# Patient Record
Sex: Female | Born: 1990 | Hispanic: Yes | Marital: Single | Smoking: Never smoker
Health system: Southern US, Community
[De-identification: ages and names within clinical notes are randomized; demographics above are authoritative.]

## PROBLEM LIST (undated history)

## (undated) DIAGNOSIS — N390 Urinary tract infection, site not specified: Secondary | ICD-10-CM

## (undated) HISTORY — PX: NO PAST SURGERIES: SHX2092

## (undated) HISTORY — DX: Urinary tract infection, site not specified: N39.0

---

## 2018-02-28 ENCOUNTER — Emergency Department: Payer: Self-pay

## 2018-02-28 ENCOUNTER — Other Ambulatory Visit: Payer: Self-pay

## 2018-02-28 ENCOUNTER — Emergency Department
Admission: EM | Admit: 2018-02-28 | Discharge: 2018-02-28 | Disposition: A | Discharge status: Home / Self Care | Payer: Self-pay | Attending: Emergency Medicine | Admitting: Emergency Medicine

## 2018-02-28 DIAGNOSIS — N938 Other specified abnormal uterine and vaginal bleeding: Secondary | ICD-10-CM | POA: Insufficient documentation

## 2018-02-28 DIAGNOSIS — N939 Abnormal uterine and vaginal bleeding, unspecified: Secondary | ICD-10-CM

## 2018-02-28 LAB — BASIC METABOLIC PANEL
Anion gap: 9 (ref 5–15)
BUN: 16 mg/dL (ref 6–20)
CALCIUM: 9.2 mg/dL (ref 8.9–10.3)
CHLORIDE: 106 mmol/L (ref 98–111)
CO2: 23 mmol/L (ref 22–32)
CREATININE: 0.69 mg/dL (ref 0.44–1.00)
GFR calc non Af Amer: >60 mL/min (ref >60)
Glucose, Bld: 89 mg/dL (ref 70–99)
Potassium: 4.2 mmol/L (ref 3.5–5.1)
SODIUM: 138 mmol/L (ref 135–145)

## 2018-02-28 LAB — CBC
HEMATOCRIT: 38 % (ref 35.0–47.0)
Hemoglobin: 13.6 g/dL (ref 12.0–16.0)
MCH: 32.4 pg (ref 26.0–34.0)
MCHC: 35.7 g/dL (ref 32.0–36.0)
MCV: 90.8 fL (ref 80.0–100.0)
Platelets: 288 10*3/uL (ref 150–440)
RBC: 4.18 MIL/uL (ref 3.80–5.20)
RDW: 12.7 % (ref 11.5–14.5)
WBC: 11.8 10*3/uL — ABNORMAL HIGH (ref 3.6–11.0)

## 2018-02-28 LAB — POCT PREGNANCY, URINE: Preg Test, Ur: NEGATIVE

## 2018-02-28 NOTE — ED Triage Notes (Signed)
Pt c/o heavy vaginal bleeding for the past 3 weeks, states she has been having issues with bleeding since last September and has been to a OB/GYn with no resolve. Pt c/o abd cramping.

## 2018-02-28 NOTE — ED Notes (Signed)
Interpreter at bedside.

## 2018-02-28 NOTE — ED Notes (Signed)
Pt able to ambulate to treatment room but appeared to be in pain when ambulating and took multiple breaks in the hall leaning over. Pt requesting an interpreter.

## 2018-02-28 NOTE — Discharge Instructions (Addendum)
Please seek medical attention for any high fevers, chest pain, shortness of breath, change in behavior, persistent vomiting, bloody stool or any other new or concerning symptoms.  

## 2018-02-28 NOTE — ED Notes (Addendum)
Interpreter requested 

## 2018-02-28 NOTE — ED Notes (Signed)
AAOx3.  Skin warm and dry.  NAD 

## 2018-02-28 NOTE — ED Notes (Signed)
Interpreter request made. 

## 2018-02-28 NOTE — ED Provider Notes (Signed)
[facility] Emergency Department Provider Note  ____________________________________________   I have reviewed the triage vital signs and the nursing notes.   HISTORY  Chief Complaint Vaginal Bleeding   History limited by: Language [facility] Interpreter utilized   HPI Janice Berry is a 27 y.o. female who presents to the emergency department today because of concerns for abnormal vaginal bleeding.  Patient states she has been having problems with vaginal bleeding for roughly 3 months.  Prior to the 3 months of abnormal vaginal bleeding she had gone a number of months without a period.  She stated that prior to that she had been quite regular.  She states that she saw her provider a little over 2 weeks ago who started her on birth control medications.  She however stopped these because she felt they are making her bleed more.  She has had some lower abdominal discomfort with the bleeding.   History reviewed. No pertinent past medical history.  There are no active problems to display for this patient.   History reviewed. No pertinent surgical history.  Prior to Admission medications   Not on File    Allergies Patient has no known allergies.  No family history on file.  Social History Social History   Tobacco Use  . Smoking status: Never Smoker  . Smokeless tobacco: Never Used  Substance Use Topics  . Alcohol use: Not Currently    Frequency: Never  . Drug use: Not Currently    Review of Systems Constitutional: No fever/chills Eyes: No visual changes. ENT: No sore throat. Cardiovascular: Denies chest pain. Respiratory: Denies shortness of breath. Gastrointestinal: Positive for suprapubic pain. Genitourinary: Negative for dysuria. Musculoskeletal: Negative for back pain. Skin: Negative for rash. Neurological: Negative for headaches, focal weakness or numbness.  ____________________________________________   PHYSICAL  EXAM:  VITAL SIGNS: ED Triage Vitals  Enc Vitals Group     BP 02/28/18 1320 121/68     Pulse Rate 02/28/18 1320 90     Resp 02/28/18 1320 18     Temp 02/28/18 1320 98.2 F (36.8 C)     Temp Source 02/28/18 1320 Oral     SpO2 02/28/18 1320 100 %     Weight 02/28/18 1320 160 lb (72.6 kg)     Height 02/28/18 1320 5\' 4"  (1.626 m)     Head Circumference --      Peak Flow --      Pain Score 02/28/18 1327 8   Constitutional: Alert and oriented.  Eyes: Conjunctivae are normal.  ENT      Head: Normocephalic and atraumatic.      Nose: No congestion/rhinnorhea.      Mouth/Throat: Mucous membranes are moist.      Neck: No stridor. Hematological/Lymphatic/Immunilogical: No cervical lymphadenopathy. Cardiovascular: Normal rate, regular rhythm.  No murmurs, rubs, or gallops.  Respiratory: Normal respiratory effort without tachypnea nor retractions. Breath sounds are clear and equal bilaterally. No wheezes/rales/rhonchi. Gastrointestinal: Soft and non tender. No rebound. No guarding.  Genitourinary: Deferred Musculoskeletal: Normal range of motion in all extremities. No lower extremity edema. Neurologic:  Normal speech and language. No gross focal neurologic deficits are appreciated.  Skin:  Skin is warm, dry and intact. No rash noted. Psychiatric: Mood and affect are normal. Speech and behavior are normal. Patient exhibits appropriate insight and judgment.  ____________________________________________    LABS (pertinent positives/negatives)  Upreg negative CBC wbc 11.8, hgb 13.6, plt 288 BMP wnl  ____________________________________________   EKG  None  ____________________________________________    RADIOLOGY  US No obvious etiology identified  ____________________________________________   PROCEDURES  Procedures  ____________________________________________   INITIAL IMPRESSION / ASSESSMENT AND PLAN / ED COURSE  Pertinent labs & imaging results that were  available during my care of the patient were reviewed by me and considered in my medical decision making (see chart for details).   Patient presented to the emergency department today because of concerns for abnormal vaginal bleeding.  Patient had been put on birth control for this but states that it seemed to cause worse bleeding.  Ultrasound was performed which did not show any obvious etiology.  Will give patient OB/GYN follow-up.  ____________________________________________   FINAL CLINICAL IMPRESSION(S) / ED DIAGNOSES  Final diagnoses:  Vagina bleeding     Note: This dictation was prepared with Dragon dictation. Any transcriptional errors that result from this process are unintentional     Phineas SemenGoodman, Cynde Menard, MD 02/28/18 1711

## 2018-06-07 NOTE — L&D Delivery Note (Addendum)
Delivery Note At 1:27 PM a viable and healthy female "Janice Berry" was delivered via Vaginal, Spontaneous (Presentation: LOA  ).  APGAR:8 9, ; weight  pending   Placenta status: spontaneous, intact .  Cord: 3VC with the following complications: none  Anesthesia:  none Episiotomy:  none Lacerations:  none Suture Repair: n/a Est. Blood Loss (mL):  300  Mom to postpartum.  Baby to Couplet care / Skin to Skin.  90WI [id number] at 39+4wks presenting with active labor, progressed quickly to 8cm dilated in triage. SROM for clear fluid while moving her to a labor room, where she pushed over an intact perineum in the bed and delivered the fetal head, followed promptly by the shoulders. She was in control the whole time, and the baby was placed on the maternal abdomen. Delayed cord clamping and the FOB cut his cord, while he was skin to skin. The placenta delivered spontaneously and intact, with trailing membranes. Small infraclitoral laceration that was hemostatic and did not require a stitch. Mom and baby tolerated the procedure well.  This delivery counts as one of Anderson Malta Oxley's supervised deliveries  Benjaman Kindler 04/28/2019, 1:46 PM

## 2018-10-11 ENCOUNTER — Other Ambulatory Visit: Payer: Self-pay | Admitting: Nurse Practitioner

## 2018-10-11 DIAGNOSIS — Z369 Encounter for antenatal screening, unspecified: Secondary | ICD-10-CM

## 2018-10-11 LAB — OB RESULTS CONSOLE VARICELLA ZOSTER ANTIBODY, IGG: Varicella: IMMUNE

## 2018-10-11 LAB — OB RESULTS CONSOLE HEPATITIS B SURFACE ANTIGEN: Hepatitis B Surface Ag: NEGATIVE

## 2018-10-11 LAB — OB RESULTS CONSOLE ABO/RH: RH Type: POSITIVE

## 2018-10-11 LAB — OB RESULTS CONSOLE RUBELLA ANTIBODY, IGM: Rubella: IMMUNE

## 2018-10-11 LAB — OB RESULTS CONSOLE HGB/HCT, BLOOD: Hemoglobin: 12.5

## 2018-10-11 LAB — OB RESULTS CONSOLE ANTIBODY SCREEN: Antibody Screen: NEGATIVE

## 2018-10-16 ENCOUNTER — Ambulatory Visit
Admission: RE | Admit: 2018-10-16 | Discharge: 2018-10-16 | Disposition: A | Discharge status: Home / Self Care | Payer: Self-pay | Source: Ambulatory Visit | Attending: Maternal & Fetal Medicine | Admitting: Maternal & Fetal Medicine

## 2018-10-16 ENCOUNTER — Other Ambulatory Visit: Payer: Self-pay

## 2018-10-16 DIAGNOSIS — Z36 Encounter for antenatal screening for chromosomal anomalies: Secondary | ICD-10-CM

## 2018-10-16 NOTE — Progress Notes (Signed)
Virtual Visit via Telephone Note  I connected with Janice Berry on Oct 16, 2018 at  9:00 AM EDT by telephone and verified that I am speaking with the correct person using two identifiers.  Referring physician:  [facility] Length of consultation:  45 minutes  Janice Berry  [facility] for genetic counseling to review prenatal screening and testing options.  This note summarizes the information we discussed with the aid of a Spanish interpreter.    We offered the following routine screening tests for this pregnancy:  The most accurate screening option for chromosome conditions is cell free fetal DNA testing.  Though this is typically reserved for pregnancies at increased risk for aneuploidy, it is currently being made available and many insurance companies are adding coverage for this testing in low risk patients during COVID.  This test utilizes a maternal blood sample and DNA sequencing technology to isolate circulating cell free fetal DNA from maternal plasma.  The fetal DNA can then be analyzed for DNA sequences that are derived from the three most common chromosomes involved in aneuploidy, chromosomes 13, 18, and 21.  If the overall amount of DNA is greater than the expected level for any of these chromosomes, aneuploidy is suspected.  The detection rates are >99% for Down syndrome, >98% for trisomy 18 and >91% for Trisomy 13.  While we do not consider it a replacement for invasive testing and karyotype analysis, a negative result from this testing would be reassuring, though not a guarantee of a normal chromosome complement for the baby.  An abnormal result may be suggestive of an abnormal chromosome complement, though we would still recommend CVS or amniocentesis to confirm any findings from this testing.  First trimester screening, which includes nuchal translucency ultrasound screen and first trimester maternal  serum marker screening, is the test that has most recently been available for low risk patients.  The nuchal translucency has approximately an 80% detection rate for Down syndrome and can be positive for other chromosome abnormalities as well as congenital heart defects.  When combined with a maternal serum marker screening, the detection rate is up to 90% for Down syndrome and up to 97% for trisomy 18.   Given current recommendations during COVID, we are offering only the biochemical testing portion of this testing (without the ultrasound and NT portion), which has a much lower detection rate.  Maternal serum marker screening, or "quad" screen, is a blood test that measures pregnancy proteins, can provide risk assessments for Down syndrome, trisomy 18, and open neural tube defects (spina bifida, anencephaly). Because it does not directly examine the fetus, it cannot positively diagnose or rule out these problems. This is a second trimester option which could be offered along with the anatomy ultrasound. It can detect approximately 75% of babies with Down syndrome, 80% of babies with open spina bifida and 70% of babies with trisomy 3418.  Targeted ultrasound uses high frequency sound waves to create an image of the developing fetus.  An ultrasound is often recommended as a routine means of evaluating the pregnancy.  It is also used to screen for fetal anatomy problems (for example, a heart defect) that might be suggestive of a chromosomal or other abnormality. We are currently not recommending a first trimester ultrasound other than that which would be ordered for dating and viability.  Should these screening tests indicate an increased concern, then the following additional testing options would be  offered:  The chorionic villus sampling procedure is available for first trimester chromosome analysis.  This involves the withdrawal of a small amount of chorionic villi (tissue from the developing placenta).   Risk of pregnancy loss is estimated to be approximately 1 in 200 to 1 in 100 (0.5 to 1%).  There is approximately a 1% (1 in 100) chance that the CVS chromosome results will be unclear.  Chorionic villi cannot be tested for neural tube defects.     Amniocentesis involves the removal of a small amount of amniotic fluid from the sac surrounding the fetus with the use of a thin needle inserted through the maternal abdomen and uterus.  Ultrasound guidance is used throughout the procedure.  Fetal cells from amniotic fluid are directly evaluated and > 99.5% of chromosome problems and > 98% of open neural tube defects can be detected. This procedure is generally performed after the 15th week of pregnancy.  The main risks to this procedure include complications leading to miscarriage in less than 1 in 200 cases (0.5%).  Cystic Fibrosis and Spinal Muscular Atrophy (SMA) screening were also discussed with the patient. Both conditions are recessive, which means that both parents must be carriers in order to have a child with the disease.  Cystic fibrosis (CF) is one of the most common genetic conditions in persons of Caucasian ancestry.  This condition occurs in approximately 1 in 2,500 Caucasian persons and results in thickened secretions in the lungs, digestive, and reproductive systems.  For a baby to be at risk for having CF, both of the parents must be carriers for this condition.  Approximately 1 in 6 Caucasian persons is a carrier for CF.  Current carrier testing looks for the most common mutations in the gene for CF and can detect approximately 90% of carriers in the Caucasian population.  This means that the carrier screening can greatly reduce, but cannot eliminate, the chance for an individual to have a child with CF.  If an individual is found to be a carrier for CF, then carrier testing would be available for the partner. As part of Janice Berry's newborn screening profile, all babies born in the state of  [place] will have a two-tier screening process.  Specimens are first tested to determine the concentration of immunoreactive trypsinogen (IRT).  The top 5% of specimens with the highest IRT values then undergo DNA testing using a panel of over 40 common CF mutations. SMA is a neurodegenerative disorder that leads to atrophy of skeletal muscle and overall weakness.  This condition is also more prevalent in the Caucasian population, with 1 in 40-1 in 60 persons being a carrier and 1 in 6,000-1 in 10,000 children being affected.  There are multiple forms of the disease, with some causing death in infancy to other forms with survival into adulthood.  The genetics of SMA is complex, but carrier screening can detect up to 95% of carriers in the Caucasian population.  Similar to CF, a negative result can greatly reduce, but cannot eliminate, the chance to have a child with SMA. The patient declined carrier screening for CF and SMA.  We talked about the option of signing up for Early Check to have the baby tested for SMA after delivery as part of a new study in [place].  This registration can be done online prior to delivery if desired. We also offered the option of carrier testing for hemoglobinopathies. The patient and her partner are of [place] ancestry.  We  obtained a detailed family history and pregnancy history.  This is the fourth pregnancy for this couple.  They have had two early miscarriages and have one 5 year old son. He was diagnosed with hearing loss that is said to have been congenital.  The patient reports that no testing has been done to determine a possible cause.  We reviewed that approximately have of congenital deafness may be inherited with recessive inheritance being the most common cause, but that without a known etiology in their son, it is not possible to offer genetic testing in this pregnancy.  The chance for recurrence may be 25% if his is due to a recessive gene inherited from both parents.   We encouraged her to follow up closely on hearing evaluations after birth for this baby.  The father of the baby's mother is said to have had 4-5 miscarriages, though these were in an area with no medical care and occurred early in pregnancy.  She did have 6 healthy children.  We discussed that there may be many reasons for pregnancy loss, including some inherited factors.  Because Janice Berry has only had 2 losses, we did not offer any additional evaluation for recurrent pregnancy loss at this time.  If the couple were to have another loss, then further assessment would be warranted. The remainder of the family history was reported to be unremarkable for birth defects, intellectual delays, recurrent pregnancy loss or known chromosome abnormalities.  In the current pregnancy, the patient reported light bleeding in late March at which time she was seen in the ER at [facility].  She has had no further complications and no exposures that would be expected to increase the risk for birth defects in this pregnancy. Per [facility] records, she had another ultrasound at [facility] at [redacted] weeks gestation, giving an EDC of 05/01/2019.  After consideration of the options, Ms. Macario Carls elected to have MaterniT21 PLUS with SCA drawn at a [facility].  The first trimester ultrasound scheduled for 10/19/2018 at [facility] was cancelled in an effort to minimize the number of visits that she needs to attend in person during COVID19 restrictions. An anatomy ultrasound will be scheduled at [redacted] weeks gestation in our clinic. The patient declined carrier testing for hemoglobinopathies, CF and SMA.  The patient e was encouraged to call with questions or concerns.  We can be contacted at [phone].  Labs ordered: MaterniT21 PLUS with SCA (at [facility])  Janice Anderson, MS, CGC      I provided 45 minutes of non-face-to-face time during this encounter.   Janice Berry

## 2018-10-16 NOTE — Progress Notes (Signed)
Hx reviewed by me, agree with assessment and plan as outlined in [facility]'s note. 

## 2018-10-19 ENCOUNTER — Ambulatory Visit: Payer: Self-pay

## 2018-10-23 ENCOUNTER — Other Ambulatory Visit: Payer: Self-pay | Admitting: Obstetrics and Gynecology

## 2018-10-27 LAB — MATERNIT21 PLUS CORE+SCA
Fetal Fraction: 7
Monosomy X (Turner Syndrome): NOT DETECTED
Result (T21): NEGATIVE
Trisomy 13 (Patau syndrome): NEGATIVE
Trisomy 18 (Edwards syndrome): NEGATIVE
Trisomy 21 (Down syndrome): NEGATIVE
XXX (Triple X Syndrome): NOT DETECTED
XXY (Klinefelter Syndrome): NOT DETECTED
XYY (Jacobs Syndrome): NOT DETECTED

## 2018-11-02 ENCOUNTER — Telehealth: Payer: Self-pay | Admitting: Obstetrics and Gynecology

## 2018-11-02 NOTE — Telephone Encounter (Signed)
The patient was informed of the results of her recent MaterniT21 testing which yielded NEGATIVE results.  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  The sensitivity for trisomy 36, trisomy 61 and trisomy 88 using this testing are reported as 99.1%, 99.9% and 91.7% respectively.  Thus, while the results of this testing are highly accurate, they are not considered diagnostic at this time.  Should more definitive information be desired, the patient may still consider amniocentesis.   As requested to know by the patient, sex chromosome analysis was included for this sample.  Results are consistent with a female fetus (Y chromosome material was present). This is predicted with >99% accuracy.  A maternal serum AFP only should be considered if screening for neural tube defects is desired.  We may be reached at [phone] with any questions or concerns.   Cherly Anderson, MS, CGC

## 2018-11-19 ENCOUNTER — Emergency Department
Admission: EM | Admit: 2018-11-19 | Discharge: 2018-11-20 | Disposition: A | Discharge status: Home / Self Care | Payer: HRSA Program | Attending: Emergency Medicine | Admitting: Emergency Medicine

## 2018-11-19 ENCOUNTER — Encounter: Payer: Self-pay | Admitting: Emergency Medicine

## 2018-11-19 DIAGNOSIS — R509 Fever, unspecified: Secondary | ICD-10-CM | POA: Diagnosis not present

## 2018-11-19 DIAGNOSIS — Z3A16 16 weeks gestation of pregnancy: Secondary | ICD-10-CM | POA: Insufficient documentation

## 2018-11-19 DIAGNOSIS — U071 COVID-19: Secondary | ICD-10-CM | POA: Insufficient documentation

## 2018-11-19 DIAGNOSIS — O98512 Other viral diseases complicating pregnancy, second trimester: Secondary | ICD-10-CM | POA: Diagnosis present

## 2018-11-19 LAB — HCG, QUANTITATIVE, PREGNANCY: hCG, Beta Chain, Quant, S: 64831 m[IU]/mL — ABNORMAL HIGH (ref <5)

## 2018-11-19 LAB — URINALYSIS, COMPLETE (UACMP) WITH MICROSCOPIC
Bilirubin Urine: NEGATIVE
Glucose, UA: NEGATIVE mg/dL
Hgb urine dipstick: NEGATIVE
Ketones, ur: 20 mg/dL — AB
Nitrite: NEGATIVE
Protein, ur: 30 mg/dL — AB
Specific Gravity, Urine: 1.021 (ref 1.005–1.030)
pH: 7 (ref 5.0–8.0)

## 2018-11-19 LAB — CBC
HCT: 34.3 % — ABNORMAL LOW (ref 36.0–46.0)
Hemoglobin: 11.7 g/dL — ABNORMAL LOW (ref 12.0–15.0)
MCH: 30.3 pg (ref 26.0–34.0)
MCHC: 34.1 g/dL (ref 30.0–36.0)
MCV: 88.9 fL (ref 80.0–100.0)
Platelets: 213 10*3/uL (ref 150–400)
RBC: 3.86 MIL/uL — ABNORMAL LOW (ref 3.87–5.11)
RDW: 12.7 % (ref 11.5–15.5)
WBC: 7.9 10*3/uL (ref 4.0–10.5)
nRBC: 0 % (ref 0.0–0.2)

## 2018-11-19 LAB — COMPREHENSIVE METABOLIC PANEL
ALT: 29 U/L (ref 0–44)
AST: 39 U/L (ref 15–41)
Albumin: 3.7 g/dL (ref 3.5–5.0)
Alkaline Phosphatase: 69 U/L (ref 38–126)
Anion gap: 9 (ref 5–15)
BUN: 6 mg/dL (ref 6–20)
CO2: 23 mmol/L (ref 22–32)
Calcium: 8.9 mg/dL (ref 8.9–10.3)
Chloride: 101 mmol/L (ref 98–111)
Creatinine, Ser: 0.58 mg/dL (ref 0.44–1.00)
GFR calc Af Amer: >60 mL/min (ref >60)
GFR calc non Af Amer: >60 mL/min (ref >60)
Glucose, Bld: 114 mg/dL — ABNORMAL HIGH (ref 70–99)
Potassium: 3.2 mmol/L — ABNORMAL LOW (ref 3.5–5.1)
Sodium: 133 mmol/L — ABNORMAL LOW (ref 135–145)
Total Bilirubin: 0.3 mg/dL (ref 0.3–1.2)
Total Protein: 7.5 g/dL (ref 6.5–8.1)

## 2018-11-19 LAB — SARS CORONAVIRUS 2 BY RT PCR (HOSPITAL ORDER, PERFORMED IN ~~LOC~~ HOSPITAL LAB): SARS Coronavirus 2: POSITIVE — AB

## 2018-11-19 LAB — TROPONIN I: Troponin I: <0.03 ng/mL (ref <0.03)

## 2018-11-19 MED ORDER — SODIUM CHLORIDE 0.9 % IV BOLUS: 1000.0000 mL | Freq: Once | INTRAVENOUS | Status: DC

## 2018-11-19 MED ORDER — ACETAMINOPHEN 500 MG PO TABS
1000.0000 mg | ORAL_TABLET | Freq: Once | ORAL | Status: AC
  Administered 2018-11-19: 1000 mg via ORAL
  Filled 2018-11-19: qty 2

## 2018-11-19 NOTE — ED Provider Notes (Signed)
[facility] Emergency Department Provider Note  Time seen: 9:47 PM  I have reviewed the triage vital signs and the nursing notes.   HISTORY  Chief Complaint Fever and Generalized Body Aches   HPI Janice Berry is a 28 y.o. female approximately [redacted] weeks pregnant who presents emergency department for body aches, fever, headache and weakness.  According to the patient over the past 3 days she has developed intermittent headaches, fevers high as 101 at home, generalized body aches and weakness symptoms.  Patient denies any cough shortness of breath or chest pain.  Denies any abdominal pain dysuria vaginal bleeding or discharge.  Denies any known sick contacts.  States currently mild to moderate headache dull in quality.  History reviewed. No pertinent past medical history.  Patient Active Problem List   Diagnosis Date Noted  . Encounter for antenatal screening for chromosomal anomalies     History reviewed. No pertinent surgical history.  Prior to Admission medications   Not on File    No Known Allergies  No family history on file.  Social History Social History   Tobacco Use  . Smoking status: Never Smoker  . Smokeless tobacco: Never Used  Substance Use Topics  . Alcohol use: Not Currently    Frequency: Never  . Drug use: Not Currently    Review of Systems Constitutional: Positive for fever.  Positive for generalized weakness. ENT: Negative for recent illness/congestion Cardiovascular: Negative for chest pain. Respiratory: Negative for shortness of breath.  Negative for cough. Gastrointestinal: Negative for abdominal pain, vomiting and diarrhea. Genitourinary: Negative for urinary compaints Musculoskeletal: Positive for generalized body aches. Skin: Negative for skin complaints  Neurological: Intermittent headache x3 days. All other ROS negative  ____________________________________________   PHYSICAL EXAM:  VITAL SIGNS: ED  Triage Vitals  Enc Vitals Group     BP 11/19/18 2133 127/71     Pulse Rate 11/19/18 2133 (!) 145     Resp 11/19/18 2133 18     Temp 11/19/18 2133 98.6 F (37 C)     Temp Source 11/19/18 2133 Oral     SpO2 11/19/18 2133 98 %     Weight 11/19/18 2134 169 lb (76.7 kg)     Height 11/19/18 2134 5' 0.63" (1.54 m)     Head Circumference --      Peak Flow --      Pain Score 11/19/18 2133 7     Pain Loc --      Pain Edu? --      Excl. in GC? --    Constitutional: Alert and oriented. Well appearing and in no distress. Eyes: Normal exam ENT      Head: Normocephalic and atraumatic      Mouth/Throat: Mucous membranes are moist. Cardiovascular: Normal rate, regular rhythm. No murmur Respiratory: Normal respiratory effort without tachypnea nor retractions. Breath sounds are clear Gastrointestinal: Soft and nontender. No distention. Musculoskeletal: Nontender with normal range of motion in all extremities. Neurologic:  Normal speech and language. No gross focal neurologic deficits Skin:  Skin is warm, dry and intact.  Psychiatric: Mood and affect are normal.   ____________________________________________    EKG  EKG viewed and interpreted by myself shows sinus tachycardia 144 bpm with a narrow QRS, normal axis, normal intervals, no concerning ST changes.  ____________________________________________    INITIAL IMPRESSION / ASSESSMENT AND PLAN / ED COURSE  Pertinent labs & imaging results that were available during my care of the patient were reviewed by me  and considered in my medical decision making (see chart for details).   Presents to the emergency department for generalized body aches, fever, intermittent headache, weakness.  Patient is afebrile currently but states she took Tylenol approximately 7 and half hours ago.  Overall the patient appears well, she is found to be quite tachycardic around 140 bpm.  Patient denies any shortness of breath.  Denies any chest pain.  She is  currently 98 to 100% on room air.  Differential at this time would include infectious etiologies such as viral infection, upper respiratory infection, COVID, urinary tract infection.  We will check labs, corona swab, urinalysis, IV hydrate and continue to closely monitor while awaiting results.  We will re-dose Tylenol as well.  Patient agreeable to plan of care.  Patient's coronavirus test is positive.  Patient's labs otherwise largely within normal limits.  We will discharge with supportive care.  Patient to call her OB/GYN first thing in the morning.  Discussed Tylenol every 6 hours as needed for fever/discomfort.  Janice Berry was evaluated in Emergency Department on 11/19/2018 for the symptoms described in the history of present illness. She was evaluated in the context of the global COVID-19 pandemic, which necessitated consideration that the patient might be at risk for infection with the SARS-CoV-2 virus that causes COVID-19. Institutional protocols and algorithms that pertain to the evaluation of patients at risk for COVID-19 are in a state of rapid change based on information released by regulatory bodies including the CDC and federal and state organizations. These policies and algorithms were followed during the patient's care in the ED.  ____________________________________________   FINAL CLINICAL IMPRESSION(S) / ED DIAGNOSES  COVID-19   Harvest Dark, MD 11/19/18 2321

## 2018-11-19 NOTE — ED Notes (Signed)
CRITICAL LAB: COVID is POSITIVE, Lab, Dr. Owens Shark and [facility] notified, orders received

## 2018-11-19 NOTE — Discharge Instructions (Addendum)
Person Under Monitoring Name: Janice Berry  Location: [place] [address]   Infection Prevention Recommendations for Individuals Confirmed to have, or Being Evaluated for, 2019 Novel Coronavirus (COVID-19) Infection Who Receive Care at Home  Individuals who are confirmed to have, or are being evaluated for, COVID-19 should follow the prevention steps below until a healthcare provider or local or state health department says they can return to normal activities.  Stay home except to get medical care You should restrict activities outside your home, except for getting medical care. Do not go to work, school, or public areas, and do not use public transportation or taxis.  Call ahead before visiting your doctor Before your medical appointment, call the healthcare provider and tell them that you have, or are being evaluated for, COVID-19 infection. This will help the healthcare providers office take steps to keep other people from getting infected. Ask your healthcare provider to call the local or state health department.  Monitor your symptoms Seek prompt medical attention if your illness is worsening (e.g., difficulty breathing). Before going to your medical appointment, call the healthcare provider and tell them that you have, or are being evaluated for, COVID-19 infection. Ask your healthcare provider to call the local or state health department.  Wear a facemask You should wear a facemask that covers your nose and mouth when you are in the same room with other people and when you visit a healthcare provider. People who live with or visit you should also wear a facemask while they are in the same room with you.  Separate yourself from other people in your home As much as possible, you should stay in a different room from other people in your home. Also, you should use a separate bathroom, if available.  Avoid sharing household items You should  not share dishes, drinking glasses, cups, eating utensils, towels, bedding, or other items with other people in your home. After using these items, you should wash them thoroughly with soap and water.  Cover your coughs and sneezes Cover your mouth and nose with a tissue when you cough or sneeze, or you can cough or sneeze into your sleeve. Throw used tissues in a lined trash can, and immediately wash your hands with soap and water for at least 20 seconds or use an alcohol-based hand rub.  Wash your [facility] your hands often and thoroughly with soap and water for at least 20 seconds. You can use an alcohol-based hand sanitizer if soap and water are not available and if your hands are not visibly dirty. Avoid touching your eyes, nose, and mouth with unwashed hands.   Prevention Steps for Caregivers and Household Members of Individuals Confirmed to have, or Being Evaluated for, COVID-19 Infection Being Cared for in the Home  If you live with, or provide care at home for, a person confirmed to have, or being evaluated for, COVID-19 infection please follow these guidelines to prevent infection:  Follow healthcare providers instructions Make sure that you understand and can help the patient follow any healthcare provider instructions for all care.  Provide for the patients basic needs You should help the patient with basic needs in the home and provide support for getting groceries, prescriptions, and other personal needs.  Monitor the patients symptoms If they are getting sicker, call his or her medical provider and tell them that the patient has, or is being evaluated for, COVID-19 infection. This will help the healthcare  providers office take steps to keep other people from getting infected. Ask the healthcare provider to call the local or state health department.  Limit the number of people who have contact with the patient If possible, have only one caregiver for the  patient. Other household members should stay in another home or place of residence. If this is not possible, they should stay in another room, or be separated from the patient as much as possible. Use a separate bathroom, if available. Restrict visitors who do not have an essential need to be in the home.  Keep older adults, very young children, and other sick people away from the patient Keep older adults, very young children, and those who have compromised immune systems or chronic health conditions away from the patient. This includes people with chronic heart, lung, or kidney conditions, diabetes, and cancer.  Ensure good ventilation Make sure that shared spaces in the home have good air flow, such as from an air conditioner or an opened window, weather permitting.  Wash your hands often Wash your hands often and thoroughly with soap and water for at least 20 seconds. You can use an alcohol based hand sanitizer if soap and water are not available and if your hands are not visibly dirty. Avoid touching your eyes, nose, and mouth with unwashed hands. Use disposable paper towels to dry your hands. If not available, use dedicated cloth towels and replace them when they become wet.  Wear a facemask and gloves Wear a disposable facemask at all times in the room and gloves when you touch or have contact with the patients blood, body fluids, and/or secretions or excretions, such as sweat, saliva, sputum, nasal mucus, vomit, urine, or feces.  Ensure the mask fits over your nose and mouth tightly, and do not touch it during use. Throw out disposable facemasks and gloves after using them. Do not reuse. Wash your hands immediately after removing your facemask and gloves. If your personal clothing becomes contaminated, carefully remove clothing and launder. Wash your hands after handling contaminated clothing. Place all used disposable facemasks, gloves, and other waste in a lined container before  disposing them with other household waste. Remove gloves and wash your hands immediately after handling these items.  Do not share dishes, glasses, or other household items with the patient Avoid sharing household items. You should not share dishes, drinking glasses, cups, eating utensils, towels, bedding, or other items with a patient who is confirmed to have, or being evaluated for, COVID-19 infection. After the person uses these items, you should wash them thoroughly with soap and water.  Wash laundry thoroughly Immediately remove and wash clothes or bedding that have blood, body fluids, and/or secretions or excretions, such as sweat, saliva, sputum, nasal mucus, vomit, urine, or feces, on them. Wear gloves when handling laundry from the patient. Read and follow directions on labels of laundry or clothing items and detergent. In general, wash and dry with the warmest temperatures recommended on the label.  Clean all areas the individual has used often Clean all touchable surfaces, such as counters, tabletops, doorknobs, bathroom fixtures, toilets, phones, keyboards, tablets, and bedside tables, every day. Also, clean any surfaces that may have blood, body fluids, and/or secretions or excretions on them. Wear gloves when cleaning surfaces the patient has come in contact with. Use a diluted bleach solution (e.g., dilute bleach with 1 part bleach and 10 parts water) or a household disinfectant with a label that says EPA-registered for coronaviruses. To make  a bleach solution at home, add 1 tablespoon of bleach to 1 quart (4 cups) of water. For a larger supply, add  cup of bleach to 1 gallon (16 cups) of water. Read labels of cleaning products and follow recommendations provided on product labels. Labels contain instructions for safe and effective use of the cleaning product including precautions you should take when applying the product, such as wearing gloves or eye protection and making sure you  have good ventilation during use of the product. Remove gloves and wash hands immediately after cleaning.  Monitor yourself for signs and symptoms of illness Caregivers and household members are considered close contacts, should monitor their health, and will be asked to limit movement outside of the home to the extent possible. Follow the monitoring steps for close contacts listed on the symptom monitoring form.   ? If you have additional questions, contact your local health department or call the epidemiologist on call at [phone] (available 24/7). ? This guidance is subject to change. For the most up-to-date guidance from [facility], please refer to their website: [email or web address]

## 2018-11-19 NOTE — ED Notes (Signed)
ED Provider at bedside with interpreter, [facility]

## 2018-11-19 NOTE — ED Triage Notes (Signed)
Patient c/o fever, body aches, headache beginning Friday. Patient is pregnant, EDD 11/4. Patient last took 500mg  tylenol at 1400.

## 2018-11-20 NOTE — ED Notes (Signed)
Peripheral IV discontinued. Catheter intact. No signs of infiltration or redness. Gauze applied to IV site.   Discharge instructions reviewed with patient. Questions fielded by this RN. Patient verbalizes understanding of instructions. Patient discharged home in stable condition per paduchowski. No acute distress noted at time of discharge.   Delay to DC d/t looking for COVID dc instructions in Spanish - none found and explained to pt with interpreter STRATUS at bedside. Pt ambulatory to parking lot, to husband's car, through side door.

## 2018-11-21 LAB — URINE CULTURE: Culture: <10000 — AB

## 2018-11-27 ENCOUNTER — Other Ambulatory Visit: Payer: Self-pay

## 2018-11-27 DIAGNOSIS — O0992 Supervision of high risk pregnancy, unspecified, second trimester: Secondary | ICD-10-CM

## 2018-11-29 DIAGNOSIS — Z3480 Encounter for supervision of other normal pregnancy, unspecified trimester: Secondary | ICD-10-CM | POA: Insufficient documentation

## 2018-11-29 DIAGNOSIS — Z8616 Personal history of COVID-19: Secondary | ICD-10-CM | POA: Insufficient documentation

## 2018-11-29 DIAGNOSIS — O0992 Supervision of high risk pregnancy, unspecified, second trimester: Secondary | ICD-10-CM

## 2018-11-29 DIAGNOSIS — Z8619 Personal history of other infectious and parasitic diseases: Secondary | ICD-10-CM | POA: Insufficient documentation

## 2018-11-29 DIAGNOSIS — R4589 Other symptoms and signs involving emotional state: Secondary | ICD-10-CM | POA: Insufficient documentation

## 2018-11-29 DIAGNOSIS — Z20828 Contact with and (suspected) exposure to other viral communicable diseases: Secondary | ICD-10-CM

## 2018-11-30 ENCOUNTER — Other Ambulatory Visit: Payer: Self-pay

## 2018-12-06 ENCOUNTER — Ambulatory Visit: Payer: HRSA Program | Admitting: Nurse Practitioner

## 2018-12-06 ENCOUNTER — Encounter: Payer: Self-pay | Admitting: Nurse Practitioner

## 2018-12-06 ENCOUNTER — Other Ambulatory Visit: Payer: Self-pay

## 2018-12-06 DIAGNOSIS — O0992 Supervision of high risk pregnancy, unspecified, second trimester: Secondary | ICD-10-CM

## 2018-12-06 DIAGNOSIS — Z20828 Contact with and (suspected) exposure to other viral communicable diseases: Secondary | ICD-10-CM

## 2018-12-06 DIAGNOSIS — R4589 Other symptoms and signs involving emotional state: Secondary | ICD-10-CM

## 2018-12-06 NOTE — Progress Notes (Signed)
TC to patient for Maternity Telehealth appt. Patient denies all covid 19 symptoms and questions, and states she feels better and is no longer in quarantine. Patient states she called [facility] on 11/30/2018 to cancel U/S appt. Because she was under quarantine. She states they told her at that time that they would call her back to reschedule, but patient has not heard back yet. Patient counseled to call [facility] tomorrow to try to reschedule.Jenetta Downer, RN  Patient states her child born in 2014 was born in [place] on 05/07/2013, and the child's due date was 05/27/2015. According to that information, child was born at 13 1/7, and was not premature.Jenetta Downer, RN

## 2018-12-06 NOTE — Progress Notes (Signed)
    PRENATAL VISIT NOTE  Subjective:  Janice Berry is a 28 y.o. [phone] at [redacted]w[redacted]d being seen today for ongoing prenatal care.  She is currently monitored for the following issues for this high-risk pregnancy and has Encounter for antenatal screening for chromosomal anomalies; Exposure to SARS virus; Supervision of high risk pregnancy in second trimester; and Other symptoms and signs involving emotional state on their problem list.  Patient reports no complaints.  Contractions: Not present. Vag. Bleeding: None.  Movement: Present. denies vomiting, abdominal pain, fussiness, diarrhea, cough and difficulty breathing leaking of fluid/ROM.   The following portions of the patient's history were reviewed and updated as appropriate: allergies, current medications, past family history, past medical history, past social history, past surgical history and problem list. Problem list updated.  Objective:  There were no vitals filed for this visit.  Fetal Status:     Movement: Present     General:  Alert, oriented and cooperative. Patient is in no acute distress.  Skin: Skin is warm and dry. No rash noted.   Cardiovascular: Normal heart rate noted  Respiratory: Normal respiratory effort, no problems with respiration noted  Abdomen: Soft, gravid, appropriate for gestational age.  Pain/Pressure: Absent     Pelvic: Cervical exam deferred        Extremities: Normal range of motion.  Edema: None  Mental Status: Normal mood and affect. Normal behavior. Normal judgment and thought content.   Assessment and Plan:  Pregnancy: G4P1021 at [redacted]w[redacted]d  1. Exposure to SARS virus Continue to monitor  2. Supervision of high risk pregnancy in second trimester Doing well - teleheatlh visit completed  3. Other symptoms and signs involving emotional state Denies any concerns at this time   Preterm labor symptoms and general obstetric precautions including but not limited to vaginal bleeding, contractions,  leaking of fluid and fetal movement were reviewed in detail with the patient. Please refer to After Visit Summary for other counseling recommendations.  No follow-ups on file.  Future Appointments  Date Time Provider [place]  01/01/2019  8:50 AM [facility] PROVIDER AC-[facility] None    Berniece Andreas, NP

## 2018-12-18 ENCOUNTER — Ambulatory Visit
Admission: RE | Admit: 2018-12-18 | Discharge: 2018-12-18 | Disposition: A | Discharge status: Home / Self Care | Payer: Self-pay | Source: Ambulatory Visit | Attending: Maternal & Fetal Medicine | Admitting: Maternal & Fetal Medicine

## 2018-12-18 DIAGNOSIS — Z20828 Contact with and (suspected) exposure to other viral communicable diseases: Secondary | ICD-10-CM

## 2018-12-18 DIAGNOSIS — R4589 Other symptoms and signs involving emotional state: Secondary | ICD-10-CM

## 2018-12-18 DIAGNOSIS — O0992 Supervision of high risk pregnancy, unspecified, second trimester: Secondary | ICD-10-CM | POA: Insufficient documentation

## 2018-12-18 DIAGNOSIS — Z3A20 20 weeks gestation of pregnancy: Secondary | ICD-10-CM | POA: Insufficient documentation

## 2018-12-24 NOTE — Progress Notes (Signed)
     PRENATAL VISIT NOTE  Subjective:  Janice Berry is a 28 y.o. [phone] at [redacted]w[redacted]d being seen today for ongoing prenatal care.  She is currently monitored for the following issues for this high-risk pregnancy and has Encounter for antenatal screening for chromosomal anomalies; Exposure to SARS virus; Supervision of high risk pregnancy in second trimester; and Other symptoms and signs involving emotional state on their problem list.  Patient reports no complaints.  Contractions: Not present. Vag. Bleeding: None.  Movement: Present. denies vomiting, abdominal pain, fussiness, diarrhea, cough and difficulty breathing leaking of fluid/ROM.   The following portions of the patient's history were reviewed and updated as appropriate: allergies, current medications, past family history, past medical history, past social history, past surgical history and problem list. Problem list updated.  Objective:  There were no vitals filed for this visit.  Fetal Status:     Movement: Present     General:  Alert, oriented and cooperative. Patient is in no acute distress.  Skin: Skin is warm and dry. No rash noted.   Cardiovascular: Normal heart rate noted  Respiratory: Normal respiratory effort, no problems with respiration noted  Abdomen: Soft, gravid, appropriate for gestational age.  Pain/Pressure: Absent     Pelvic: Cervical exam deferred        Extremities: Normal range of motion.  Edema: None  Mental Status: Normal mood and affect. Normal behavior. Normal judgment and thought content.   Assessment and Plan:  Pregnancy: [id number] at [redacted]w[redacted]d  Preterm labor symptoms and general obstetric precautions including but not limited to vaginal bleeding, contractions, leaking of fluid and fetal movement were reviewed in detail with the patient. Please refer to After Visit Summary for other counseling recommendations.  Return in about 4 weeks (around 01/03/2019) for routine prenatal care.  Future  Appointments  Date Time Provider [place]  01/01/2019  8:50 AM [facility] PROVIDER AC-MAT None    Berniece Andreas, NP   Subjective:  [name] is a 28 y.o. female.  The patient reports they do not have symptoms.  Patient has the following medical conditionshas Encounter for antenatal screening for chromosomal anomalies; Exposure to SARS virus; Supervision of high risk pregnancy in second trimester; and Other symptoms and signs involving emotional state on their problem list.  Chief Complaint  Patient presents with  . Routine Prenatal Visit    Telehealth appt.    Patient reports  HPI   See flowsheet for further details and programmatic requirements.    The following portions of the patient's history were reviewed and updated as appropriate: allergies, current medications, past family history, past medical history, past social history, past surgical history and problem list. Problem list updated.  Objective:  There were no vitals filed for this visit.  Physical Exam    Assessment and Plan:  [name] is a 28 y.o. female      Return in about 4 weeks (around 01/03/2019) for routine prenatal care.  Future Appointments  Date Time Provider [place]  01/01/2019  8:50 AM AC-MH PROVIDER AC-MAT None    Berniece Andreas, NP

## 2019-01-01 ENCOUNTER — Ambulatory Visit: Payer: HRSA Program | Admitting: Nurse Practitioner

## 2019-01-01 ENCOUNTER — Other Ambulatory Visit: Payer: Self-pay

## 2019-01-01 DIAGNOSIS — R4589 Other symptoms and signs involving emotional state: Secondary | ICD-10-CM

## 2019-01-01 DIAGNOSIS — O0992 Supervision of high risk pregnancy, unspecified, second trimester: Secondary | ICD-10-CM

## 2019-01-01 DIAGNOSIS — Z20828 Contact with and (suspected) exposure to other viral communicable diseases: Secondary | ICD-10-CM

## 2019-01-01 NOTE — Progress Notes (Signed)
Client denies any headaches or vaginal bleeding Denies any questions at this time   PRENATAL VISIT NOTE  Subjective:  Janice Berry is a 28 y.o. G4P1021 at [redacted]w[redacted]d being seen today for ongoing prenatal care.  She is currently monitored for the following issues for this high-risk pregnancy and has Encounter for antenatal screening for chromosomal anomalies; Exposure to SARS virus; Supervision of high risk pregnancy in second trimester; and Other symptoms and signs involving emotional state on their problem list.  Patient reports no complaints.   .  .   . Denies leaking of fluid/ROM.   The following portions of the patient's history were reviewed and updated as appropriate: allergies, current medications, past family history, past medical history, past social history, past surgical history and problem list. Problem list updated.  Objective:  There were no vitals filed for this visit.  Fetal Status:           General:  Alert, oriented and cooperative. Patient is in no acute distress.  Skin: Skin is warm and dry. No rash noted.   Cardiovascular: Normal heart rate noted  Respiratory: Normal respiratory effort, no problems with respiration noted  Abdomen: Soft, gravid, appropriate for gestational age.        Pelvic: Cervical exam deferred        Extremities: Normal range of motion.     Mental Status: Normal mood and affect. Normal behavior. Normal judgment and thought content.   Assessment and Plan:  Pregnancy: G4P1021 at [redacted]w[redacted]d   1. Supervision of high risk pregnancy in second trimester Doing well Advised 4 wks telehealth visit Then 1 wk 28 wk lab visit in house     Preterm labor symptoms and general obstetric precautions including but not limited to vaginal bleeding, contractions, leaking of fluid and fetal movement were reviewed in detail with the patient. Please refer to After Visit Summary for other counseling recommendations.  No follow-ups on file.  No future  appointments.  Berniece Andreas, NP

## 2019-01-01 NOTE — Progress Notes (Signed)
Recent +Covid in June; removed from isolation and denies exposure or s/s for son accompanying; [name] updated today Janice Lat, Janice Berry

## 2019-01-29 ENCOUNTER — Ambulatory Visit: Payer: Self-pay

## 2019-01-30 ENCOUNTER — Encounter: Payer: Self-pay | Admitting: Advanced Practice Midwife

## 2019-01-30 ENCOUNTER — Ambulatory Visit: Payer: Self-pay

## 2019-01-30 ENCOUNTER — Ambulatory Visit: Payer: HRSA Program | Admitting: Advanced Practice Midwife

## 2019-01-30 ENCOUNTER — Other Ambulatory Visit: Payer: Self-pay

## 2019-01-30 VITALS — BP 113/71 | Temp 98.7°F | Wt 175.6 lb

## 2019-01-30 DIAGNOSIS — O0992 Supervision of high risk pregnancy, unspecified, second trimester: Secondary | ICD-10-CM

## 2019-01-30 DIAGNOSIS — O99019 Anemia complicating pregnancy, unspecified trimester: Secondary | ICD-10-CM | POA: Insufficient documentation

## 2019-01-30 DIAGNOSIS — K219 Gastro-esophageal reflux disease without esophagitis: Secondary | ICD-10-CM | POA: Insufficient documentation

## 2019-01-30 DIAGNOSIS — O0990 Supervision of high risk pregnancy, unspecified, unspecified trimester: Secondary | ICD-10-CM

## 2019-01-30 DIAGNOSIS — O99012 Anemia complicating pregnancy, second trimester: Secondary | ICD-10-CM

## 2019-01-30 LAB — HEMOGLOBIN, FINGERSTICK: Hemoglobin: 10.5 g/dL — ABNORMAL LOW (ref 11.1–15.9)

## 2019-01-30 LAB — HIV ANTIBODY (ROUTINE TESTING W REFLEX): HIV 1&2 Ab, 4th Generation: NONREACTIVE

## 2019-01-30 MED ORDER — PRENATAL VITAMIN 27-0.8 MG PO TABS: 1.0000 | ORAL_TABLET | Freq: Every day | ORAL | 0 refills | Status: AC

## 2019-01-30 MED ORDER — FERROUS SULFATE 325 (65 FE) MG PO TABS: 325.0000 mg | ORAL_TABLET | Freq: Every day | ORAL | 2 refills | Status: DC

## 2019-01-30 NOTE — Progress Notes (Signed)
In for visit; taking PNV-more given today; Inda Castle, RPR, HIV today; agrees to Tdap vaccine; will attempt to find name of [facility] practice for Pap Debera Lat, RN

## 2019-01-30 NOTE — Progress Notes (Signed)
   PRENATAL VISIT NOTE  Subjective:  Janice Berry is a 28 y.o. G4P1021 at [redacted]w[redacted]d being seen today for ongoing prenatal care.  She is currently monitored for the following issues for this high-risk pregnancy and has Encounter for antenatal screening for chromosomal anomalies; Exposure to SARS virus; Supervision of high risk pregnancy in second trimester; Other symptoms and signs involving emotional state; Anemia in pregnancy, second trimester; and Gastroesophageal reflux disease without esophagitis on their problem list.  Patient reports heartburn and constipation.  Contractions: Not present. Vag. Bleeding: None.  Movement: Present. Denies leaking of fluid/ROM.   The following portions of the patient's history were reviewed and updated as appropriate: allergies, current medications, past family history, past medical history, past social history, past surgical history and problem list. Problem list updated.   Epic down from 8:00-1:55 and unable to access chart during this visit  Objective:   Vitals:   01/30/19 1017  BP: 113/71  Temp: 98.7 F (37.1 C)  Weight: 175 lb 9.6 oz (79.7 kg)    Fetal Status: Fetal Heart Rate (bpm): 140 Fundal Height: 28 cm Movement: Present     General:  Alert, oriented and cooperative. Patient is in no acute distress.  Skin: Skin is warm and dry. No rash noted.   Cardiovascular: Normal heart rate noted  Respiratory: Normal respiratory effort, no problems with respiration noted  Abdomen: Soft, gravid, appropriate for gestational age.  Pain/Pressure: Absent     Pelvic: Cervical exam deferred        Extremities: Normal range of motion.  Edema: None  Mental Status: Normal mood and affect. Normal behavior. Normal judgment and thought content.   Assessment and Plan:  Pregnancy: G4P1021 at 109w0d  1. Supervision of high risk pregnancy in second trimester  - Glucose, 1 hour gestational - RPR - HIV Power LAB - Hemoglobin, venipuncture -  Fe+CBC/D/Plt+TIBC+Fer+Retic - Tdap vaccine greater than or equal to 7yo IM - Prenatal Vit-Fe Fumarate-FA (PRENATAL VITAMIN) 27-0.8 MG TABS; Take 1 tablet by mouth daily.  Dispense: 100 tablet; Refill: 0 - ferrous sulfate 325 (65 FE) MG tablet; Take 1 tablet (325 mg total) by mouth daily with breakfast.  Dispense: 100 tablet; Refill: 2  2. Anemia in pregnancy, second trimester Continue Fe - Hemoglobin, venipuncture  3. Supervision of high risk pregnancy, antepartum Feels well. Taking PNV.  Questions answered  4. Gastroesophageal reflux disease without esophagitis Counseled and suggestions given  5.  Constipation Suggestions given and pt counseled   Preterm labor symptoms and general obstetric precautions including but not limited to vaginal bleeding, contractions, leaking of fluid and fetal movement were reviewed in detail with the patient. Please refer to After Visit Summary for other counseling recommendations.  Return in about 2 weeks (around 02/13/2019) for routine [facility], telehealth.  Future Appointments  Date Time Provider [place]  02/13/2019  3:00 PM [facility] PROVIDER AC-[facility] None    Herbie Saxon, CNM

## 2019-01-31 LAB — FE+CBC/D/PLT+TIBC+FER+RETIC
Basophils Absolute: 0 10*3/uL (ref 0.0–0.2)
Basos: 0 %
EOS (ABSOLUTE): 0.1 10*3/uL (ref 0.0–0.4)
Eos: 1 %
Ferritin: 15 ng/mL (ref 15–150)
Hematocrit: 30.5 % — ABNORMAL LOW (ref 34.0–46.6)
Hemoglobin: 10.5 g/dL — ABNORMAL LOW (ref 11.1–15.9)
Immature Grans (Abs): 0 10*3/uL (ref 0.0–0.1)
Immature Granulocytes: 0 %
Iron Saturation: 11 % — ABNORMAL LOW (ref 15–55)
Iron: 51 ug/dL (ref 27–159)
Lymphocytes Absolute: 2.4 10*3/uL (ref 0.7–3.1)
Lymphs: 19 %
MCH: 31.9 pg (ref 26.6–33.0)
MCHC: 34.4 g/dL (ref 31.5–35.7)
MCV: 93 fL (ref 79–97)
Monocytes Absolute: 0.4 10*3/uL (ref 0.1–0.9)
Monocytes: 4 %
Neutrophils Absolute: 9.3 10*3/uL — ABNORMAL HIGH (ref 1.4–7.0)
Neutrophils: 76 %
Platelets: 238 10*3/uL (ref 150–450)
RBC: 3.29 x10E6/uL — ABNORMAL LOW (ref 3.77–5.28)
RDW: 13.5 % (ref 11.7–15.4)
Retic Ct Pct: 2.3 % (ref 0.6–2.6)
Total Iron Binding Capacity: 474 ug/dL — ABNORMAL HIGH (ref 250–450)
UIBC: 423 ug/dL (ref 131–425)
WBC: 12.3 10*3/uL — ABNORMAL HIGH (ref 3.4–10.8)

## 2019-01-31 LAB — GLUCOSE, 1 HOUR GESTATIONAL: Gestational Diabetes Screen: 151 mg/dL — ABNORMAL HIGH (ref 65–139)

## 2019-01-31 LAB — RPR: RPR Ser Ql: NONREACTIVE

## 2019-01-31 LAB — HEMOGLOBIN, FINGERSTICK: Hemoglobin: 10.5 g/dL — ABNORMAL LOW (ref 11.1–15.9)

## 2019-02-01 ENCOUNTER — Telehealth: Payer: Self-pay

## 2019-02-01 DIAGNOSIS — O99810 Abnormal glucose complicating pregnancy: Secondary | ICD-10-CM | POA: Insufficient documentation

## 2019-02-01 NOTE — Telephone Encounter (Signed)
Call to client via interpreter [phone]), discussed abnl glucose and 3 Hr Gtt; appt. Scheduled 02/02/19 with instructions given Debera Lat, RN

## 2019-02-02 ENCOUNTER — Other Ambulatory Visit: Payer: Self-pay

## 2019-02-02 ENCOUNTER — Other Ambulatory Visit: Payer: HRSA Program

## 2019-02-02 DIAGNOSIS — O0992 Supervision of high risk pregnancy, unspecified, second trimester: Secondary | ICD-10-CM

## 2019-02-02 NOTE — Progress Notes (Signed)
Patient in nurse clinic today for 3 hour GTT. Instructions given, patient sent to lab. No history of travel noted. Deontae Robson, RN   

## 2019-02-03 LAB — GLUCOSE TOLERANCE TEST, 6 HOUR
Glucose, 1 Hour GTT: 161 mg/dL (ref 65–199)
Glucose, 2 hour: 131 mg/dL (ref 65–139)
Glucose, 3 hour: 110 mg/dL — ABNORMAL HIGH (ref 65–109)
Glucose, GTT - Fasting: 84 mg/dL (ref 65–99)

## 2019-02-13 ENCOUNTER — Ambulatory Visit: Payer: HRSA Program

## 2019-02-15 ENCOUNTER — Other Ambulatory Visit: Payer: Self-pay

## 2019-02-15 ENCOUNTER — Ambulatory Visit: Payer: HRSA Program | Admitting: Physician Assistant

## 2019-02-15 DIAGNOSIS — O99012 Anemia complicating pregnancy, second trimester: Secondary | ICD-10-CM

## 2019-02-15 DIAGNOSIS — O99810 Abnormal glucose complicating pregnancy: Secondary | ICD-10-CM

## 2019-02-15 DIAGNOSIS — O0992 Supervision of high risk pregnancy, unspecified, second trimester: Secondary | ICD-10-CM

## 2019-02-15 NOTE — Progress Notes (Signed)
Call to client for [place] visit; confirmed identity-agrees to visit; taking Fe & PNV Debera Lat, RN

## 2019-02-15 NOTE — Progress Notes (Signed)
   TELEPHONE OBSTETRICS VISIT ENCOUNTER NOTE  I connected with@ on 02/15/19 at  8:40 AM EDT by telephone at home and verified that I am speaking with the correct person using two identifiers.   I discussed the limitations, risks, security and privacy concerns of performing an evaluation and management service by telephone and the availability of in person appointments. I also discussed with the patient that there may be a patient responsible charge related to this service. The patient expressed understanding and agreed to proceed.  Subjective:  Janice Berry is a 28 y.o. G4P1021 at [redacted]w[redacted]d being followed for ongoing prenatal care.  She is currently monitored for the following issues for this low-risk pregnancy and has Encounter for antenatal screening for chromosomal anomalies; Exposure to SARS virus; Supervision of high risk pregnancy in second trimester; Other symptoms and signs involving emotional state; Anemia in pregnancy, second trimester; Gastroesophageal reflux disease without esophagitis; and Abnormal glucose tolerance test in pregnancy, antepartum on their problem list.  Patient reports no complaints. Reports fetal movement. Denies any contractions, bleeding or leaking of fluid.   The following portions of the patient's history were reviewed and updated as appropriate: allergies, current medications, past family history, past medical history, past social history, past surgical history and problem list.   Objective:   General:  Alert, oriented and cooperative.   Mental Status: Normal mood and affect perceived. Normal judgment and thought content.  Rest of physical exam deferred due to type of encounter  Assessment and Plan:  Pregnancy: G4P1021 at [redacted]w[redacted]d 1. Abnormal glucose tolerance test in pregnancy, antepartum Reviewed normal Gh OGTT result with pt.  2. Anemia in pregnancy, second trimester Taking oral iron as instructed, enc to continue.  3. Supervision of high risk  pregnancy in second trimester Enc po liquids, has infant carseat, agrees to f/u for next routine visit in 2 weeks.  Preterm labor symptoms and general obstetric precautions including but not limited to vaginal bleeding, contractions, leaking of fluid and fetal movement were reviewed in detail with the patient.  I discussed the assessment and treatment plan with the patient. The patient was provided an opportunity to ask questions and all were answered. The patient agreed with the plan and demonstrated an understanding of the instructions. The patient was advised to call back or seek an in-person office evaluation/go to the hospital for any urgent or concerning symptoms.  Please refer to After Visit Summary for other counseling recommendations.   I provided 7 minutes of non-face-to-face time during this encounter.  Return in about 2 weeks (around 03/01/2019) for Routine prenatal care.  Future Appointments  Date Time Provider [place]  03/01/2019  1:20 PM [facility] PROVIDER [facility] None    Lora Havens, PA-C

## 2019-02-20 ENCOUNTER — Ambulatory Visit: Payer: Self-pay | Admitting: Advanced Practice Midwife

## 2019-02-20 ENCOUNTER — Other Ambulatory Visit: Payer: Self-pay

## 2019-02-20 DIAGNOSIS — Z20828 Contact with and (suspected) exposure to other viral communicable diseases: Secondary | ICD-10-CM

## 2019-02-20 DIAGNOSIS — O99810 Abnormal glucose complicating pregnancy: Secondary | ICD-10-CM

## 2019-02-20 DIAGNOSIS — O0992 Supervision of high risk pregnancy, unspecified, second trimester: Secondary | ICD-10-CM

## 2019-02-20 DIAGNOSIS — O99012 Anemia complicating pregnancy, second trimester: Secondary | ICD-10-CM

## 2019-02-20 DIAGNOSIS — R4589 Other symptoms and signs involving emotional state: Secondary | ICD-10-CM

## 2019-02-20 NOTE — Progress Notes (Signed)
Walk in to [facility] with c/o pruritic rash that started Wednesday or Thursday last week and has not responded to Benadryl or Hydrocortisone Cream. Rash continues to worsen and is now on abdomen, lower back and upper legs. Client with scattered flat to slightly red raised bumps on abd and states some on lower back. Reports one bump "came out" on upper right breast and it contains very small amt whitish fluid. Client denies pruritis other than rash locations. denies fever. Client denies change in any hygiene products, clothes detergent, new clothers new foods, etc. Also states no one else in house has a rash and has not noticed in bugs in her bed / home. Rich Number, RN

## 2019-02-20 NOTE — Progress Notes (Signed)
S:  28 yo G4P1021 [redacted] wks pregnant, walks in without appt c/o pruritic rash onset 02/14/19 that began on abdomen and spread to low back, upper legs, arms, and now one on breast.  No rash on hands, palms, feet, webs of fingers/toes.  No animals in home.  FOB and toddler son sleep in her bed with her occasionally and do not have sxs.  Uses Nivea lotion daily past 4 months after showering.  Sometimes uses dryer sheets in dryer. Has tried Benadryl cream 1x/day x 4 days and Hydrocortisone cream today without relief. O:  Scattered red flat to sl raised bumps more on abdomen than extremities.   Pruritic. No change in soap or detergents.   A: r/o intrahepatic cholestasis of pregnancy vs scabies P:  To return fasting in am for fasting bile acids. Rx given for Triamcinolone 0.1% ointment with 1 RF to use TID on affected areas until blood work returns

## 2019-02-21 ENCOUNTER — Other Ambulatory Visit: Payer: HRSA Program

## 2019-02-21 ENCOUNTER — Other Ambulatory Visit: Payer: Self-pay

## 2019-02-21 DIAGNOSIS — O0993 Supervision of high risk pregnancy, unspecified, third trimester: Secondary | ICD-10-CM

## 2019-02-21 DIAGNOSIS — L282 Other prurigo: Secondary | ICD-10-CM

## 2019-02-21 NOTE — Progress Notes (Signed)
[facility]. Per client, last oral intake at 8:30 pm 02/20/2019. Client counseled results most likely available tomorrow and would be reviewd by [facility] provider. Rich Number, RN

## 2019-02-22 NOTE — Addendum Note (Signed)
Addended by: Cletis Media on: 02/22/2019 11:08 AM   Modules accepted: Orders

## 2019-02-22 NOTE — Addendum Note (Signed)
Addended by: Cletis Media on: 02/22/2019 11:07 AM   Modules accepted: Orders

## 2019-02-23 LAB — BILE ACIDS, TOTAL: Bile Acids Total: 6.8 umol/L (ref 0.0–10.0)

## 2019-02-26 ENCOUNTER — Telehealth: Payer: Self-pay

## 2019-02-26 NOTE — Telephone Encounter (Signed)
Per request of Dr. Ernestina Patches, phone call to client to ascertain effectiveness of Triamcinolone on rash. Per client, since began using medicine, rash and pruritis have resolved. Client aware of [place] appt on 03/01/2019. Rich Number, RN

## 2019-03-01 ENCOUNTER — Telehealth: Payer: Self-pay

## 2019-03-01 ENCOUNTER — Other Ambulatory Visit: Payer: Self-pay

## 2019-03-01 ENCOUNTER — Ambulatory Visit: Payer: Self-pay

## 2019-03-01 NOTE — Telephone Encounter (Signed)
Attempted to call and reschedule today's appt. Due to provider out; left message via interpreter M. [place] Herbert Aguinaldo, RN

## 2019-03-05 ENCOUNTER — Ambulatory Visit: Payer: HRSA Program | Admitting: Family Medicine

## 2019-03-05 ENCOUNTER — Other Ambulatory Visit: Payer: Self-pay

## 2019-03-05 DIAGNOSIS — Z23 Encounter for immunization: Secondary | ICD-10-CM

## 2019-03-05 DIAGNOSIS — O99012 Anemia complicating pregnancy, second trimester: Secondary | ICD-10-CM

## 2019-03-05 DIAGNOSIS — Z20828 Contact with and (suspected) exposure to other viral communicable diseases: Secondary | ICD-10-CM

## 2019-03-05 DIAGNOSIS — O99810 Abnormal glucose complicating pregnancy: Secondary | ICD-10-CM

## 2019-03-05 DIAGNOSIS — Z3480 Encounter for supervision of other normal pregnancy, unspecified trimester: Secondary | ICD-10-CM

## 2019-03-05 DIAGNOSIS — R4589 Other symptoms and signs involving emotional state: Secondary | ICD-10-CM

## 2019-03-05 DIAGNOSIS — O99019 Anemia complicating pregnancy, unspecified trimester: Secondary | ICD-10-CM

## 2019-03-05 LAB — HEMOGLOBIN, FINGERSTICK: Hemoglobin: 10.6 g/dL — ABNORMAL LOW (ref 11.1–15.9)

## 2019-03-05 NOTE — Progress Notes (Signed)
   PRENATAL VISIT NOTE  Subjective:  Janice Berry is a 28 y.o. [phone] at [redacted]w[redacted]d being seen today for ongoing prenatal care.  She is currently monitored for the following issues for this low-risk pregnancy and has Exposure to SARS virus; Supervision of other normal pregnancy, antepartum; Other symptoms and signs involving emotional state; Anemia affecting pregnancy, antepartum; Gastroesophageal reflux disease without esophagitis; and Abnormal glucose tolerance test in pregnancy, antepartum on their problem list.  Patient reports no complaints.  Contractions: Not present. Vag. Bleeding: None.  Movement: Present. Denies leaking of fluid/ROM.   The following portions of the patient's history were reviewed and updated as appropriate: allergies, current medications, past family history, past medical history, past social history, past surgical history and problem list. Problem list updated.  Objective:   Vitals:   03/05/19 1323  BP: 110/69  Temp: 98.1 F (36.7 C)  Weight: 180 lb (81.6 kg)    Fetal Status: Fetal Heart Rate (bpm): 135 Fundal Height: 32 cm Movement: Present     General:  Alert, oriented and cooperative. Patient is in no acute distress.  Skin: Skin is warm and dry. No rash noted.   Cardiovascular: Normal heart rate noted  Respiratory: Normal respiratory effort, no problems with respiration noted  Abdomen: Soft, gravid, appropriate for gestational age.  Pain/Pressure: Absent     Pelvic: Cervical exam deferred        Extremities: Normal range of motion.  Edema: None  Mental Status: Normal mood and affect. Normal behavior. Normal judgment and thought content.   Assessment and Plan:  Pregnancy: [id number] at [redacted]w[redacted]d  1. Abnormal glucose tolerance test in pregnancy, antepartum 3hr wnl  2. Anemia in pregnancy, second trimester Last was mildly low at 10.5. Repeat today  - Hemoglobin, venipuncture  3. Exposure to SARS virus Well out of isolation at this point  4.  Supervision of other normal pregnancy, antepartum Up to date Son and niece helped with FHT today Rash improved  5. Other symptoms and signs involving emotional state Monitor sx Recommend LCSW reach out to patient at 2 wk pp   6. Anemia affecting pregnancy, antepartum Repeat hgb today   Preterm labor symptoms and general obstetric precautions including but not limited to vaginal bleeding, contractions, leaking of fluid and fetal movement were reviewed in detail with the patient. Please refer to After Visit Summary for other counseling recommendations.  Return in about 2 weeks (around 03/19/2019) for Routine prenatal care.  No future appointments.  Caren Macadam, MD

## 2019-03-05 NOTE — Addendum Note (Signed)
Addended by: Debera Lat on: 03/05/2019 02:52 PM   Modules accepted: Orders

## 2019-03-05 NOTE — Progress Notes (Signed)
In for visit; denies hospital visits; taking Fe & PNV; used Triamcinolone cream last night-rash improved; undecided on [facility] Debera Lat, RN

## 2019-03-19 ENCOUNTER — Ambulatory Visit: Payer: Self-pay | Admitting: Family Medicine

## 2019-03-19 ENCOUNTER — Other Ambulatory Visit: Payer: Self-pay

## 2019-03-19 ENCOUNTER — Encounter: Payer: Self-pay | Admitting: Family Medicine

## 2019-03-19 DIAGNOSIS — Z8619 Personal history of other infectious and parasitic diseases: Secondary | ICD-10-CM

## 2019-03-19 DIAGNOSIS — O99019 Anemia complicating pregnancy, unspecified trimester: Secondary | ICD-10-CM

## 2019-03-19 DIAGNOSIS — O99810 Abnormal glucose complicating pregnancy: Secondary | ICD-10-CM

## 2019-03-19 DIAGNOSIS — R4589 Other symptoms and signs involving emotional state: Secondary | ICD-10-CM

## 2019-03-19 DIAGNOSIS — Z3480 Encounter for supervision of other normal pregnancy, unspecified trimester: Secondary | ICD-10-CM

## 2019-03-19 DIAGNOSIS — Z8616 Personal history of COVID-19: Secondary | ICD-10-CM

## 2019-03-19 NOTE — Progress Notes (Signed)
   PRENATAL VISIT NOTE  Subjective:  Janice Berry is a 28 y.o. [phone] at [redacted]w[redacted]d being seen today for ongoing prenatal care.  She is currently monitored for the following issues for this low-risk pregnancy and has History of 2019 novel coronavirus disease (COVID-19); Supervision of other normal pregnancy, antepartum; Other symptoms and signs involving emotional state; Anemia affecting pregnancy, antepartum; Gastroesophageal reflux disease without esophagitis; and Abnormal glucose tolerance test in pregnancy, antepartum on their problem list.  Patient reports no complaints.  Contractions: Not present. Vag. Bleeding: None.  Movement: Present. Denies leaking of fluid/ROM.   The following portions of the patient's history were reviewed and updated as appropriate: allergies, current medications, past family history, past medical history, past social history, past surgical history and problem list. Problem list updated.  Objective:   Vitals:   03/19/19 1106  BP: 108/72  Temp: 99.1 F (37.3 C)  Weight: 181 lb (82.1 kg)    Fetal Status: Fetal Heart Rate (bpm): 140 Fundal Height: 34 cm Movement: Present  Presentation: Vertex  General:  Alert, oriented and cooperative. Patient is in no acute distress.  Skin: Skin is warm and dry. No rash noted.   Cardiovascular: Normal heart rate noted  Respiratory: Normal respiratory effort, no problems with respiration noted  Abdomen: Soft, gravid, appropriate for gestational age.  Pain/Pressure: Absent     Pelvic: Cervical exam deferred        Extremities: Normal range of motion.  Edema: None  Mental Status: Normal mood and affect. Normal behavior. Normal judgment and thought content.   Assessment and Plan:  Pregnancy: G4P1021 at [redacted]w[redacted]d  1. Abnormal glucose tolerance test in pregnancy, antepartum 3hr gtt wnl  2. Anemia affecting pregnancy, antepartum Continue fe and recheck at next visit Lab Results  Component Value Date   HGB 10.5 (L)  01/30/2019   HGB 11.7 (L) 11/19/2018  )   3. Exposure to SARS virus Completed quarantine  4. Supervision of other normal pregnancy, antepartum Up to date No BP cuff and needs in person visits  5. Other symptoms and signs involving emotional state Referred to LCSW but screening since have been low risk   Preterm labor symptoms and general obstetric precautions including but not limited to vaginal bleeding, contractions, leaking of fluid and fetal movement were reviewed in detail with the patient. Please refer to After Visit Summary for other counseling recommendations.  Return in about 2 weeks (around 04/02/2019) for Routine prenatal care, in person.  Future Appointments  Date Time Provider [place]  04/02/2019 10:20 AM [facility] PROVIDER [facility] None    Caren Macadam, MD

## 2019-03-19 NOTE — Progress Notes (Signed)
Here today for a 33.6 week MH RV. Taking PNV and Iron QD, denies ED/hospital visits since last RV. Hal Morales, RN

## 2019-03-30 ENCOUNTER — Observation Stay
Admission: EM | Admit: 2019-03-30 | Discharge: 2019-03-30 | Disposition: A | Discharge status: Home / Self Care | Payer: Self-pay | Attending: Certified Nurse Midwife | Admitting: Certified Nurse Midwife

## 2019-03-30 ENCOUNTER — Other Ambulatory Visit: Payer: Self-pay

## 2019-03-30 DIAGNOSIS — O26899 Other specified pregnancy related conditions, unspecified trimester: Secondary | ICD-10-CM | POA: Diagnosis present

## 2019-03-30 DIAGNOSIS — Z3A35 35 weeks gestation of pregnancy: Secondary | ICD-10-CM | POA: Insufficient documentation

## 2019-03-30 DIAGNOSIS — Z3480 Encounter for supervision of other normal pregnancy, unspecified trimester: Secondary | ICD-10-CM

## 2019-03-30 DIAGNOSIS — B9689 Other specified bacterial agents as the cause of diseases classified elsewhere: Secondary | ICD-10-CM | POA: Insufficient documentation

## 2019-03-30 DIAGNOSIS — O99019 Anemia complicating pregnancy, unspecified trimester: Secondary | ICD-10-CM

## 2019-03-30 DIAGNOSIS — N898 Other specified noninflammatory disorders of vagina: Secondary | ICD-10-CM | POA: Diagnosis present

## 2019-03-30 DIAGNOSIS — O99810 Abnormal glucose complicating pregnancy: Secondary | ICD-10-CM

## 2019-03-30 DIAGNOSIS — R4589 Other symptoms and signs involving emotional state: Secondary | ICD-10-CM

## 2019-03-30 DIAGNOSIS — O23593 Infection of other part of genital tract in pregnancy, third trimester: Principal | ICD-10-CM | POA: Insufficient documentation

## 2019-03-30 LAB — WET PREP, GENITAL
Sperm: NONE SEEN
Trich, Wet Prep: NONE SEEN
Yeast Wet Prep HPF POC: NONE SEEN

## 2019-03-30 LAB — RUPTURE OF MEMBRANE (ROM)PLUS: Rom Plus: NEGATIVE

## 2019-03-30 MED ORDER — METRONIDAZOLE 500 MG PO TABS: 500.0000 mg | ORAL_TABLET | Freq: Two times a day (BID) | ORAL | 0 refills | Status: AC

## 2019-03-30 NOTE — OB Triage Note (Signed)
Discharge instructions discussed with patient using interpreter Andria Meuse [phone]. Discussed follow up care and preterm labor precautions. Patient verbalized understanding, all questions answered at this time.

## 2019-03-30 NOTE — Discharge Summary (Signed)
Janice Berry is a 28 y.o. female. She is at [redacted]w[redacted]d gestation. No LMP recorded. Patient is pregnant. Estimated Date of Delivery: 05/01/19  Prenatal care site: [facility]   Chief complaint: discharge, possible leakage of fluid Location: vagina Onset/timing: this morning Duration: intermittent Quality: white watery discharge Severity: mild Aggravating or alleviating conditions: none Associated signs/symptoms: vaginal pressure, contractions Context: Janice Berry reports vaginal discharge since this morning. She reports no large gushes, but noticing some moisture on her underwear and when she used the bathroom throughout the day. It has been white and very thin. She reports that around 1700, she started to feel vaginal pressure and contractions.   S: Resting comfortably.   She reports:  -active fetal movement -no vaginal bleeding -onset of contractions at 1700, feeling occasionally  Maternal Medical History:   Past Medical History:  Diagnosis Date  . UTI (urinary tract infection)    Hx yrs. ago    No past surgical history on file.  No Known Allergies  Prior to Admission medications   Medication Sig Start Date End Date Taking? Authorizing Provider  ferrous sulfate 325 (65 FE) MG tablet Take 1 tablet (325 mg total) by mouth daily with breakfast. 01/30/19  Yes Sciora, Real Cons, CNM  Prenatal Vit-Fe Fumarate-FA (PRENATAL VITAMIN) 27-0.8 MG TABS Take 1 tablet by mouth daily. 01/30/19 05/10/19 Yes Sciora, Real Cons, CNM     Social History: She  reports that she has never smoked. She has never used smokeless tobacco. She reports previous alcohol use. She reports previous drug use.  Family History: family history includes Diabetes in her father; Hearing loss in her son; Heart attack in her father.   Review of Systems: A full review of systems was performed and negative except as noted in the HPI.     O:  BP 126/80 (BP Location: Left Arm)   Pulse 84   Temp 98.5 F (36.9 C)  (Oral)   Resp 18   Ht 5' 0.63" (1.54 m)   Wt 82.1 kg   BMI 34.62 kg/m  Results for orders placed or performed during the hospital encounter of 03/30/19 (from the past 48 hour(s))  Wet prep, genital   Collection Time: 03/30/19  9:07 PM   Specimen: Vaginal  Result Value Ref Range   Yeast Wet Prep HPF POC NONE SEEN NONE SEEN   Trich, Wet Prep NONE SEEN NONE SEEN   Clue Cells Wet Prep HPF POC PRESENT (A) NONE SEEN   WBC, Wet Prep HPF POC MANY (A) NONE SEEN   Sperm NONE SEEN   ROM Plus ([facility] only)   Collection Time: 03/30/19  9:07 PM  Result Value Ref Range   Rom Plus NEGATIVE      Constitutional: NAD, AAOx3  HE/ENT: extraocular movements grossly intact, moist mucous membranes CV: RRR PULM: normal respiratory effort, CTABL     Abd: gravid, non-tender, non-distended, soft      Ext: Non-tender, Nonedmeatous   Psych: mood appropriate, speech normal Pelvic: SSE: moderate amount of white discharge, no pooling of fluid, no ferning, no fluid  from cervix with Valsalva  SVE: 1cm/long/high posterior/medium  NST:  Baseline: 125bpm Variability: moderate Accelerations: 15x15 present x >2 Decelerations: absent Time: 71mins Toco: occasional contraction with uterine irritability    A/P: 28 y.o. [redacted]w[redacted]d here for antenatal surveillance during pregnancy.  Principle diagnosis: Bacterial vaginosis during pregnancy  Labor  Not present  Patient feeling occasional contraction  Fetal Wellbeing  Reactive NST, reassuring for GA  Concern for LOF/PPROM  Rom Plus negative  Bacterial vaginosis  Wet prep with clue cells  Prescription for metronidazole 500mg  BID x 7 days sent to preferred pharmacy  D/c home stable, precautions reviewed, follow-up as scheduled.    03/30/2019 9:48 PM  ----- 04/01/2019, CNM Certified Nurse Midwife [facility], Department of OB/GYN [facility]

## 2019-03-30 NOTE — OB Triage Note (Signed)
Patient arrived in triage with complaints of leaking white watery discharge since she woke up today. Also reports feeling vaginal pressure like baby is pushing down in the vagina. Reports feeling contractions since approx 1700. Reports good fetal movement. Monitors applied and assessing. M. Haviland notified and at bedside. Information obtained using interpreter Lannette Donath [phone].

## 2019-04-02 ENCOUNTER — Ambulatory Visit: Payer: HRSA Program

## 2019-04-04 ENCOUNTER — Ambulatory Visit: Payer: HRSA Program | Admitting: Advanced Practice Midwife

## 2019-04-04 ENCOUNTER — Other Ambulatory Visit: Payer: Self-pay

## 2019-04-04 VITALS — BP 117/77 | Temp 98.2°F | Wt 183.8 lb

## 2019-04-04 DIAGNOSIS — O99810 Abnormal glucose complicating pregnancy: Secondary | ICD-10-CM

## 2019-04-04 DIAGNOSIS — Z3480 Encounter for supervision of other normal pregnancy, unspecified trimester: Secondary | ICD-10-CM

## 2019-04-04 DIAGNOSIS — B379 Candidiasis, unspecified: Secondary | ICD-10-CM

## 2019-04-04 DIAGNOSIS — O99019 Anemia complicating pregnancy, unspecified trimester: Secondary | ICD-10-CM

## 2019-04-04 DIAGNOSIS — Z822 Family history of deafness and hearing loss: Secondary | ICD-10-CM | POA: Insufficient documentation

## 2019-04-04 LAB — HEMOGLOBIN, FINGERSTICK: Hemoglobin: 11.7 g/dL (ref 11.1–15.9)

## 2019-04-04 LAB — WET PREP FOR TRICH, YEAST, CLUE: Trichomonas Exam: NEGATIVE

## 2019-04-04 LAB — OB RESULTS CONSOLE GBS: GBS: NEGATIVE

## 2019-04-04 MED ORDER — CLOTRIMAZOLE 1 % VA CREA: 1.0000 | TOPICAL_CREAM | Freq: Every day | VAGINAL | 0 refills | Status: AC

## 2019-04-04 NOTE — Progress Notes (Signed)
In for visit; reports taking Fe & PNV; reports seen @ [facility] and dg with +BV-will complete med tomorrow; undecided on [place]; declines self collection of cultures Debera Lat, RN

## 2019-04-04 NOTE — Addendum Note (Signed)
Addended by: Hal Morales A on: 04/04/2019 02:53 PM   Modules accepted: Orders

## 2019-04-04 NOTE — Progress Notes (Signed)
Reviewed wet prep results. Patient treated for a Yeast Infection per standing order. Hal Morales, RN

## 2019-04-04 NOTE — Progress Notes (Signed)
   PRENATAL VISIT NOTE  Subjective:  Janice Berry is a 28 y.o. [phone] at [redacted]w[redacted]d being seen today for ongoing prenatal care.  She is currently monitored for the following issues for this low-risk pregnancy and has History of 2019 novel coronavirus disease (COVID-19); Supervision of other normal pregnancy, antepartum; Other symptoms and signs involving emotional state; Anemia affecting pregnancy, antepartum; Gastroesophageal reflux disease without esophagitis; Abnormal glucose tolerance test in pregnancy, antepartum; Vaginal discharge during pregnancy--BV; and Family history of deafness--son with neuro deafness with hearing aids in both ears on their problem list.  Patient reports cramping daily in lower abdomen with dysuria all week.  Contractions: Not present. Vag. Bleeding: None.  Movement: Present. Denies leaking of fluid/ROM.   The following portions of the patient's history were reviewed and updated as appropriate: allergies, current medications, past family history, past medical history, past social history, past surgical history and problem list. Problem list updated.  Objective:   Vitals:   04/04/19 1323  BP: 117/77  Temp: 98.2 F (36.8 C)  Weight: 183 lb 12.8 oz (83.4 kg)    Fetal Status: Fetal Heart Rate (bpm): 120 Fundal Height: 35 cm Movement: Present  Presentation: Vertex  General:  Alert, oriented and cooperative. Patient is in no acute distress.  Skin: Skin is warm and dry. No rash noted.   Cardiovascular: Normal heart rate noted  Respiratory: Normal respiratory effort, no problems with respiration noted  Abdomen: Soft, gravid, appropriate for gestational age.  Pain/Pressure: Absent     Pelvic: Cervical exam performed Dilation: 1 Effacement (%): 50 Station: -3  Extremities: Normal range of motion.  Edema: None  Mental Status: Normal mood and affect. Normal behavior. Normal judgment and thought content.   Assessment and Plan:  Pregnancy: G4P1021 at [redacted]w[redacted]d  1.  Abnormal glucose tolerance test in pregnancy, antepartum 3 hr GTT wnl 02/02/19  2. Anemia affecting pregnancy, antepartum Taking FeSo4  3. Supervision of other normal pregnancy, antepartum C/o low abdominal cramping with dysuria all week.  Drinks 1 coke 2x/wk.  C&S done as welll as 36 wk cultures Went to L&D for discharge 03/30/19 and diagnosed with BV and will finish tx tomorrow.  Wet mount done Knows when to go to L&D - Chlamydia/GC NAA, Confirmation - GBS Culture - Urine Culture - WET PREP FOR TRICH, YEAST, CLUE  4. Family history of deafness Young son with    Preterm labor symptoms and general obstetric precautions including but not limited to vaginal bleeding, contractions, leaking of fluid and fetal movement were reviewed in detail with the patient. Please refer to After Visit Summary for other counseling recommendations.  Return in about 1 week (around 04/11/2019) for routine PNC.  No future appointments.  Herbie Saxon, CNM

## 2019-04-06 LAB — URINE CULTURE

## 2019-04-08 LAB — CHLAMYDIA/GC NAA, CONFIRMATION
Chlamydia trachomatis, NAA: NEGATIVE
Neisseria gonorrhoeae, NAA: NEGATIVE

## 2019-04-08 LAB — CULTURE, BETA STREP (GROUP B ONLY): Strep Gp B Culture: NEGATIVE

## 2019-04-09 ENCOUNTER — Telehealth: Payer: Self-pay | Admitting: Obstetrics and Gynecology

## 2019-04-09 NOTE — Telephone Encounter (Signed)
I called Ms. Janice Berry with the aid of a Spanish interpreter regarding the scheduled genetic counseling visit at [facility] on 04/16/2019. She is scheduled to discuss the family history of hearing loss in her son.  The patient was seen for genetic counseling and this was discussed at her visit on 10/16/2018.  Please see that consult note for details.  She stated that there are no new questions or medical information about his condition and therefore we cancelled this visit.    Wilburt Finlay, MS, CGC

## 2019-04-11 ENCOUNTER — Ambulatory Visit: Payer: HRSA Program | Admitting: Advanced Practice Midwife

## 2019-04-11 ENCOUNTER — Other Ambulatory Visit: Payer: Self-pay

## 2019-04-11 DIAGNOSIS — O99019 Anemia complicating pregnancy, unspecified trimester: Secondary | ICD-10-CM

## 2019-04-11 DIAGNOSIS — Z3480 Encounter for supervision of other normal pregnancy, unspecified trimester: Secondary | ICD-10-CM

## 2019-04-11 DIAGNOSIS — O99810 Abnormal glucose complicating pregnancy: Secondary | ICD-10-CM

## 2019-04-11 DIAGNOSIS — R4589 Other symptoms and signs involving emotional state: Secondary | ICD-10-CM

## 2019-04-11 NOTE — Progress Notes (Signed)
Patient here for MH RV at 37 1/7. Undecided about BCM, wants to use condoms, but unsure about other methods. Patient states she has information pamphlet about BCM and will bring it with questions to next visit.Jenetta Downer, RN

## 2019-04-11 NOTE — Progress Notes (Signed)
   PRENATAL VISIT NOTE  Subjective:  Janice Berry is a 28 y.o. [phone] at [redacted]w[redacted]d being seen today for ongoing prenatal care.  She is currently monitored for the following issues for this low-risk pregnancy and has History of 2019 novel coronavirus disease (COVID-19); Supervision of other normal pregnancy, antepartum; Other symptoms and signs involving emotional state; Anemia affecting pregnancy, antepartum; Gastroesophageal reflux disease without esophagitis; Abnormal glucose tolerance test in pregnancy, antepartum; Vaginal discharge during pregnancy--BV; and Family history of deafness--son with neuro deafness with hearing aids in both ears on their problem list.  Patient reports no complaints.  Contractions: Not present. Vag. Bleeding: None.  Movement: Present. Denies leaking of fluid/ROM.   The following portions of the patient's history were reviewed and updated as appropriate: allergies, current medications, past family history, past medical history, past social history, past surgical history and problem list. Problem list updated.  Objective:   Vitals:   04/11/19 1316  BP: 121/73  Temp: (!) 97.3 F (36.3 C)  Weight: 185 lb 3.2 oz (84 kg)    Fetal Status: Fetal Heart Rate (bpm): 130 Fundal Height: 36 cm Movement: Present  Presentation: Vertex  General:  Alert, oriented and cooperative. Patient is in no acute distress.  Skin: Skin is warm and dry. No rash noted.   Cardiovascular: Normal heart rate noted  Respiratory: Normal respiratory effort, no problems with respiration noted  Abdomen: Soft, gravid, appropriate for gestational age.  Pain/Pressure: Absent     Pelvic: Cervical exam deferred        Extremities: Normal range of motion.  Edema: None  Mental Status: Normal mood and affect. Normal behavior. Normal judgment and thought content.   Assessment and Plan:  Pregnancy: G4P1021 at [redacted]w[redacted]d  2. Anemia affecting pregnancy, antepartum Taking FeSo4 I daily  3. Supervision  of other normal pregnancy, antepartum Knows when to go to L&D.  Here with young son with bilateral hearing aids  4. Other symptoms and signs involving emotional state Saw [place] once for counseling and declines need for more sessions   Preterm labor symptoms and general obstetric precautions including but not limited to vaginal bleeding, contractions, leaking of fluid and fetal movement were reviewed in detail with the patient. Please refer to After Visit Summary for other counseling recommendations.  No follow-ups on file.  No future appointments.  Herbie Saxon, CNM

## 2019-04-16 ENCOUNTER — Ambulatory Visit: Payer: Self-pay

## 2019-04-17 ENCOUNTER — Ambulatory Visit: Payer: Self-pay | Admitting: Advanced Practice Midwife

## 2019-04-17 ENCOUNTER — Other Ambulatory Visit: Payer: Self-pay

## 2019-04-17 DIAGNOSIS — O99810 Abnormal glucose complicating pregnancy: Secondary | ICD-10-CM

## 2019-04-17 DIAGNOSIS — Z3480 Encounter for supervision of other normal pregnancy, unspecified trimester: Secondary | ICD-10-CM

## 2019-04-17 DIAGNOSIS — R4589 Other symptoms and signs involving emotional state: Secondary | ICD-10-CM

## 2019-04-17 DIAGNOSIS — O99019 Anemia complicating pregnancy, unspecified trimester: Secondary | ICD-10-CM

## 2019-04-17 DIAGNOSIS — Z822 Family history of deafness and hearing loss: Secondary | ICD-10-CM

## 2019-04-17 NOTE — Progress Notes (Signed)
Here today for 38.0 week MH RV. Taking PNV and Iron QD. Denies ED/hospital visits since last RV. Wants to use condoms for PP BCM. Hal Morales, RN

## 2019-04-17 NOTE — Progress Notes (Signed)
   PRENATAL VISIT NOTE  Subjective:  Janice Berry is a 28 y.o. G4P1021 at [redacted]w[redacted]d being seen today for ongoing prenatal care.  She is currently monitored for the following issues for this low-risk pregnancy and has History of 2019 novel coronavirus disease (COVID-19); Supervision of other normal pregnancy, antepartum; Other symptoms and signs involving emotional state; Anemia affecting pregnancy, antepartum; Gastroesophageal reflux disease without esophagitis; Abnormal glucose tolerance test in pregnancy, antepartum; Vaginal discharge during pregnancy--BV; and Family history of deafness--son with neuro deafness with hearing aids in both ears on their problem list.  Patient reports no complaints.  Contractions: Not present. Vag. Bleeding: None.  Movement: Present. Denies leaking of fluid/ROM.   The following portions of the patient's history were reviewed and updated as appropriate: allergies, current medications, past family history, past medical history, past social history, past surgical history and problem list. Problem list updated.  Objective:   Vitals:   04/17/19 1321  BP: 121/71  Temp: 98.8 F (37.1 C)  Weight: 188 lb (85.3 kg)    Fetal Status: Fetal Heart Rate (bpm): 130 Fundal Height: 37 cm Movement: Present  Presentation: Vertex  General:  Alert, oriented and cooperative. Patient is in no acute distress.  Skin: Skin is warm and dry. No rash noted.   Cardiovascular: Normal heart rate noted  Respiratory: Normal respiratory effort, no problems with respiration noted  Abdomen: Soft, gravid, appropriate for gestational age.  Pain/Pressure: Absent     Pelvic: Cervical exam deferred        Extremities: Normal range of motion.  Edema: None  Mental Status: Normal mood and affect. Normal behavior. Normal judgment and thought content.   Assessment and Plan:  Pregnancy: G4P1021 at [redacted]w[redacted]d  1. Abnormal glucose tolerance test in pregnancy, antepartum 3 hr GTT=wnl  2. Anemia  affecting pregnancy, antepartum Taking I FeSo4 daily with oj  3. Supervision of other normal pregnancy, antepartum Feels tired.  Ready for baby at home.  Knows when to go to L&D  5. Family history of deafness--son with neuro deafness with hearing aids in both ears Genetic counselor called pt and said they discussed this already at 10/16/18 genetic counseling appt   Term labor symptoms and general obstetric precautions including but not limited to vaginal bleeding, contractions, leaking of fluid and fetal movement were reviewed in detail with the patient. Please refer to After Visit Summary for other counseling recommendations.  Return in about 1 week (around 04/24/2019) for routine PNC.  No future appointments.  Herbie Saxon, CNM

## 2019-04-19 ENCOUNTER — Encounter: Payer: Self-pay | Admitting: Physician Assistant

## 2019-04-19 DIAGNOSIS — R4589 Other symptoms and signs involving emotional state: Secondary | ICD-10-CM

## 2019-04-19 DIAGNOSIS — O99019 Anemia complicating pregnancy, unspecified trimester: Secondary | ICD-10-CM

## 2019-04-19 DIAGNOSIS — Z3480 Encounter for supervision of other normal pregnancy, unspecified trimester: Secondary | ICD-10-CM

## 2019-04-19 DIAGNOSIS — O99810 Abnormal glucose complicating pregnancy: Secondary | ICD-10-CM

## 2019-04-24 ENCOUNTER — Ambulatory Visit: Payer: HRSA Program | Admitting: Advanced Practice Midwife

## 2019-04-24 ENCOUNTER — Other Ambulatory Visit: Payer: Self-pay

## 2019-04-24 DIAGNOSIS — O99810 Abnormal glucose complicating pregnancy: Secondary | ICD-10-CM

## 2019-04-24 DIAGNOSIS — Z3480 Encounter for supervision of other normal pregnancy, unspecified trimester: Secondary | ICD-10-CM

## 2019-04-24 DIAGNOSIS — R4589 Other symptoms and signs involving emotional state: Secondary | ICD-10-CM

## 2019-04-24 DIAGNOSIS — O99019 Anemia complicating pregnancy, unspecified trimester: Secondary | ICD-10-CM

## 2019-04-24 NOTE — Progress Notes (Signed)
In for visit; denies hospital visits; taking Fe & PNV Debera Lat, RN

## 2019-04-24 NOTE — Progress Notes (Signed)
   PRENATAL VISIT NOTE  Subjective:  Janice Berry is a 28 y.o. [phone] at [redacted]w[redacted]d being seen today for ongoing prenatal care.  She is currently monitored for the following issues for this low-risk pregnancy and has History of 2019 novel coronavirus disease (COVID-19); Supervision of other normal pregnancy, antepartum; Other symptoms and signs involving emotional state; Anemia affecting pregnancy, antepartum; Gastroesophageal reflux disease without esophagitis; Abnormal glucose tolerance test in pregnancy, antepartum; Vaginal discharge during pregnancy--BV; and Family history of deafness--son with neuro deafness with hearing aids in both ears on their problem list.  Patient reports occasional contractions.  Contractions: Not present. Vag. Bleeding: None.  Movement: Present. Denies leaking of fluid/ROM.   The following portions of the patient's history were reviewed and updated as appropriate: allergies, current medications, past family history, past medical history, past social history, past surgical history and problem list. Problem list updated.  Objective:   Vitals:   04/24/19 0956  BP: 120/76  Temp: 98.3 F (36.8 C)  Weight: 186 lb (84.4 kg)    Fetal Status: Fetal Heart Rate (bpm): 140 Fundal Height: 37 cm Movement: Present  Presentation: Vertex  General:  Alert, oriented and cooperative. Patient is in no acute distress.  Skin: Skin is warm and dry. No rash noted.   Cardiovascular: Normal heart rate noted  Respiratory: Normal respiratory effort, no problems with respiration noted  Abdomen: Soft, gravid, appropriate for gestational age.  Pain/Pressure: Absent     Pelvic: Cervical exam performed Dilation: 2 Effacement (%): 50 Station: -2  Extremities: Normal range of motion.  Edema: None  Mental Status: Normal mood and affect. Normal behavior. Normal judgment and thought content.   Assessment and Plan:  Pregnancy: G4P1021 at [redacted]w[redacted]d  1. Abnormal glucose tolerance test in  pregnancy, antepartum 3 hr GTT wnl  2. Anemia affecting pregnancy, antepartum Taking FeSo4 I daily  3. Supervision of other normal pregnancy, antepartum Yesterday had u/c's x3 hrs which resolved spontaneously.  Knows when to go to L&D.  Desires IOL 05/08/19--paperwork completed   Term labor symptoms and general obstetric precautions including but not limited to vaginal bleeding, contractions, leaking of fluid and fetal movement were reviewed in detail with the patient. Please refer to After Visit Summary for other counseling recommendations.  Return in about 1 week (around 05/01/2019) for routine PNC.  No future appointments.  Herbie Saxon, CNM

## 2019-04-28 ENCOUNTER — Encounter: Payer: Self-pay | Admitting: *Deleted

## 2019-04-28 ENCOUNTER — Other Ambulatory Visit: Payer: Self-pay

## 2019-04-28 ENCOUNTER — Inpatient Hospital Stay
Admission: EM | Admit: 2019-04-28 | Discharge: 2019-04-30 | DRG: 807 | Disposition: A | Discharge status: Home / Self Care | Payer: Medicaid Other | Attending: Obstetrics and Gynecology | Admitting: Obstetrics and Gynecology

## 2019-04-28 DIAGNOSIS — O99019 Anemia complicating pregnancy, unspecified trimester: Secondary | ICD-10-CM

## 2019-04-28 DIAGNOSIS — Z20828 Contact with and (suspected) exposure to other viral communicable diseases: Secondary | ICD-10-CM | POA: Diagnosis present

## 2019-04-28 DIAGNOSIS — Z3A39 39 weeks gestation of pregnancy: Secondary | ICD-10-CM

## 2019-04-28 DIAGNOSIS — Z3480 Encounter for supervision of other normal pregnancy, unspecified trimester: Secondary | ICD-10-CM

## 2019-04-28 DIAGNOSIS — Z789 Other specified health status: Secondary | ICD-10-CM | POA: Diagnosis present

## 2019-04-28 DIAGNOSIS — R633 Feeding difficulties: Secondary | ICD-10-CM

## 2019-04-28 DIAGNOSIS — O26893 Other specified pregnancy related conditions, third trimester: Secondary | ICD-10-CM | POA: Diagnosis present

## 2019-04-28 DIAGNOSIS — O99810 Abnormal glucose complicating pregnancy: Secondary | ICD-10-CM

## 2019-04-28 DIAGNOSIS — R6339 Other feeding difficulties: Secondary | ICD-10-CM

## 2019-04-28 DIAGNOSIS — R4589 Other symptoms and signs involving emotional state: Secondary | ICD-10-CM

## 2019-04-28 LAB — CBC
HCT: 35.8 % — ABNORMAL LOW (ref 36.0–46.0)
Hemoglobin: 12.7 g/dL (ref 12.0–15.0)
MCH: 31.1 pg (ref 26.0–34.0)
MCHC: 35.5 g/dL (ref 30.0–36.0)
MCV: 87.7 fL (ref 80.0–100.0)
Platelets: 239 10*3/uL (ref 150–400)
RBC: 4.08 MIL/uL (ref 3.87–5.11)
RDW: 13.5 % (ref 11.5–15.5)
WBC: 11.4 10*3/uL — ABNORMAL HIGH (ref 4.0–10.5)
nRBC: 0 % (ref 0.0–0.2)

## 2019-04-28 LAB — TYPE AND SCREEN
ABO/RH(D): O POS
Antibody Screen: NEGATIVE

## 2019-04-28 LAB — SARS CORONAVIRUS 2 BY RT PCR (HOSPITAL ORDER, PERFORMED IN ~~LOC~~ HOSPITAL LAB): SARS Coronavirus 2: NEGATIVE

## 2019-04-28 MED ORDER — SENNOSIDES-DOCUSATE SODIUM 8.6-50 MG PO TABS
2.0000 | ORAL_TABLET | ORAL | Status: DC
  Administered 2019-04-28 – 2019-04-29 (×2): 2 via ORAL
  Filled 2019-04-28 (×2): qty 2

## 2019-04-28 MED ORDER — OXYCODONE-ACETAMINOPHEN 5-325 MG PO TABS: 1.0000 | ORAL_TABLET | ORAL | Status: DC | PRN

## 2019-04-28 MED ORDER — FLEET ENEMA 7-19 GM/118ML RE ENEM: 1.0000 | ENEMA | Freq: Every day | RECTAL | Status: DC | PRN

## 2019-04-28 MED ORDER — OXYTOCIN 10 UNIT/ML IJ SOLN
INTRAMUSCULAR | Status: AC
  Filled 2019-04-28: qty 2

## 2019-04-28 MED ORDER — BUTORPHANOL TARTRATE 1 MG/ML IJ SOLN
INTRAMUSCULAR | Status: AC
  Administered 2019-04-28: 13:00:00 1 mg via INTRAVENOUS
  Filled 2019-04-28: qty 1

## 2019-04-28 MED ORDER — BENZOCAINE-MENTHOL 20-0.5 % EX AERO: 1.0000 "application " | INHALATION_SPRAY | CUTANEOUS | Status: DC | PRN

## 2019-04-28 MED ORDER — SIMETHICONE 80 MG PO CHEW: 80.0000 mg | CHEWABLE_TABLET | ORAL | Status: DC | PRN

## 2019-04-28 MED ORDER — OXYTOCIN 40 UNITS IN NORMAL SALINE INFUSION - SIMPLE MED
2.5000 [IU]/h | INTRAVENOUS | Status: DC
  Administered 2019-04-28: 14:00:00 2.5 [IU]/h via INTRAVENOUS

## 2019-04-28 MED ORDER — LACTATED RINGERS IV SOLN: 500.0000 mL | INTRAVENOUS | Status: DC | PRN

## 2019-04-28 MED ORDER — LIDOCAINE HCL (PF) 1 % IJ SOLN: 30.0000 mL | INTRAMUSCULAR | Status: DC | PRN

## 2019-04-28 MED ORDER — LACTATED RINGERS IV SOLN
INTRAVENOUS | Status: DC
  Administered 2019-04-28: 12:00:00 via INTRAVENOUS

## 2019-04-28 MED ORDER — BUTORPHANOL TARTRATE 1 MG/ML IJ SOLN
1.0000 mg | INTRAMUSCULAR | Status: DC | PRN
  Administered 2019-04-28: 13:00:00 1 mg via INTRAVENOUS
  Filled 2019-04-28: qty 1

## 2019-04-28 MED ORDER — SOD CITRATE-CITRIC ACID 500-334 MG/5ML PO SOLN: 30.0000 mL | ORAL | Status: DC | PRN

## 2019-04-28 MED ORDER — OXYTOCIN 40 UNITS IN NORMAL SALINE INFUSION - SIMPLE MED
INTRAVENOUS | Status: AC
  Administered 2019-04-28: 500 mL via INTRAVENOUS
  Filled 2019-04-28: qty 1000

## 2019-04-28 MED ORDER — AMMONIA AROMATIC IN INHA
RESPIRATORY_TRACT | Status: AC
  Filled 2019-04-28: qty 10

## 2019-04-28 MED ORDER — ONDANSETRON HCL 4 MG/2ML IJ SOLN: 4.0000 mg | Freq: Four times a day (QID) | INTRAMUSCULAR | Status: DC | PRN

## 2019-04-28 MED ORDER — ONDANSETRON HCL 4 MG PO TABS: 4.0000 mg | ORAL_TABLET | ORAL | Status: DC | PRN

## 2019-04-28 MED ORDER — IBUPROFEN 600 MG PO TABS
600.0000 mg | ORAL_TABLET | Freq: Four times a day (QID) | ORAL | Status: DC
  Administered 2019-04-28 – 2019-04-30 (×7): 600 mg via ORAL
  Filled 2019-04-28 (×8): qty 1

## 2019-04-28 MED ORDER — MISOPROSTOL 200 MCG PO TABS
ORAL_TABLET | ORAL | Status: AC
  Filled 2019-04-28: qty 4

## 2019-04-28 MED ORDER — PRENATAL MULTIVITAMIN CH
1.0000 | ORAL_TABLET | Freq: Every day | ORAL | Status: DC
  Administered 2019-04-29 – 2019-04-30 (×2): 1 via ORAL
  Filled 2019-04-28 (×2): qty 1

## 2019-04-28 MED ORDER — COCONUT OIL OIL
1.0000 "application " | TOPICAL_OIL | Status: DC | PRN
  Filled 2019-04-28: qty 120

## 2019-04-28 MED ORDER — OXYCODONE-ACETAMINOPHEN 5-325 MG PO TABS: 2.0000 | ORAL_TABLET | ORAL | Status: DC | PRN

## 2019-04-28 MED ORDER — DIBUCAINE (PERIANAL) 1 % EX OINT: 1.0000 "application " | TOPICAL_OINTMENT | CUTANEOUS | Status: DC | PRN

## 2019-04-28 MED ORDER — LIDOCAINE HCL (PF) 1 % IJ SOLN
INTRAMUSCULAR | Status: AC
  Filled 2019-04-28: qty 30

## 2019-04-28 MED ORDER — TETANUS-DIPHTH-ACELL PERTUSSIS 5-2.5-18.5 LF-MCG/0.5 IM SUSP
0.5000 mL | Freq: Once | INTRAMUSCULAR | Status: DC
  Filled 2019-04-28: qty 0.5

## 2019-04-28 MED ORDER — SODIUM CHLORIDE 0.9% FLUSH: 3.0000 mL | Freq: Two times a day (BID) | INTRAVENOUS | Status: DC

## 2019-04-28 MED ORDER — ZOLPIDEM TARTRATE 5 MG PO TABS: 5.0000 mg | ORAL_TABLET | Freq: Every evening | ORAL | Status: DC | PRN

## 2019-04-28 MED ORDER — SODIUM CHLORIDE 0.9% FLUSH: 3.0000 mL | INTRAVENOUS | Status: DC | PRN

## 2019-04-28 MED ORDER — OXYTOCIN BOLUS FROM INFUSION
500.0000 mL | Freq: Once | INTRAVENOUS | Status: AC
  Administered 2019-04-28: 14:00:00 500 mL via INTRAVENOUS

## 2019-04-28 MED ORDER — BISACODYL 10 MG RE SUPP
10.0000 mg | Freq: Every day | RECTAL | Status: DC | PRN
  Filled 2019-04-28: qty 1

## 2019-04-28 MED ORDER — WITCH HAZEL-GLYCERIN EX PADS: 1.0000 "application " | MEDICATED_PAD | CUTANEOUS | Status: DC | PRN

## 2019-04-28 MED ORDER — OXYCODONE HCL 5 MG PO TABS: 5.0000 mg | ORAL_TABLET | ORAL | Status: DC | PRN

## 2019-04-28 MED ORDER — SODIUM CHLORIDE 0.9 % IV SOLN: 250.0000 mL | INTRAVENOUS | Status: DC | PRN

## 2019-04-28 MED ORDER — ACETAMINOPHEN 325 MG PO TABS: 650.0000 mg | ORAL_TABLET | ORAL | Status: DC | PRN

## 2019-04-28 MED ORDER — MEASLES, MUMPS & RUBELLA VAC IJ SOLR
0.5000 mL | Freq: Once | INTRAMUSCULAR | Status: DC
  Filled 2019-04-28: qty 0.5

## 2019-04-28 MED ORDER — ACETAMINOPHEN 325 MG PO TABS
650.0000 mg | ORAL_TABLET | ORAL | Status: DC | PRN
  Administered 2019-04-28 – 2019-04-29 (×2): 650 mg via ORAL
  Filled 2019-04-28 (×2): qty 2

## 2019-04-28 MED ORDER — DIPHENHYDRAMINE HCL 25 MG PO CAPS: 25.0000 mg | ORAL_CAPSULE | Freq: Four times a day (QID) | ORAL | Status: DC | PRN

## 2019-04-28 MED ORDER — ONDANSETRON HCL 4 MG/2ML IJ SOLN: 4.0000 mg | INTRAMUSCULAR | Status: DC | PRN

## 2019-04-28 NOTE — Plan of Care (Signed)

## 2019-04-28 NOTE — Progress Notes (Signed)
Interpretor [id number] used for reassessment and medication administration. Assisted pt to BR without complications. No needs or concerns expressed at this time. Will cont to monitor

## 2019-04-28 NOTE — Progress Notes (Signed)
Used in house [employer] for introduction, shift assessment, Education, and any questions or concerns. No needs expressed at this time. Will cont to monitor

## 2019-04-28 NOTE — H&P (Signed)
Janice Berry is a 28 y.o. female presenting for active labor with hx of precipitous delivery.  Prenatal History: [id number]   EDC : 05/01/2019, by Ultrasound  Prenatal care at [facility] Prenatal course complicated by  - Hx of premature delivery - hx of son with hearing loss  - abnormal glucola, 3hr wnl - normal anatomy scan   OB History    Gravida  4   Para  1   Term  1   Preterm  0   AB  2   Living  1     SAB  2   TAB      Ectopic      Multiple      Live Births  1          Past Medical History:  Diagnosis Date  . UTI (urinary tract infection)    Hx yrs. ago   Past Surgical History:  Procedure Laterality Date  . NO PAST SURGERIES     Family History: family history includes Diabetes in her father; Hearing loss in her son; Heart attack in her father. Social History:  reports that she has never smoked. She has never used smokeless tobacco. She reports previous alcohol use. She reports previous drug use.     Maternal Diabetes: No Genetic Screening: Normal Maternal Ultrasounds/Referrals: Normal Fetal Ultrasounds or other Referrals:  Other:  genetic testing with [facility] was normal Maternal Substance Abuse:  No Significant Maternal Medications:  None Significant Maternal Lab Results:  None Other Comments:  None  Review of Systems  All other systems reviewed and are negative.  Maternal Medical History:  Reason for admission: Contractions.   Contractions: Frequency: regular.   Perceived severity is moderate.      Dilation: 4 Effacement (%): 80 Station: -1 Exam by:: Michal Callicott CNM Blood pressure 123/75, pulse 85, temperature 98.5 F (36.9 C), temperature source Oral, resp. rate 20, height 5' 0.63" (1.54 m), weight 84.4 kg, SpO2 99 %. Maternal Exam:  Uterine Assessment: Contraction strength is moderate.  Contraction frequency is regular.   Abdomen: Patient reports no abdominal tenderness. Fetal presentation: vertex  Introitus: Normal vulva. Normal  vagina.  Pelvis: adequate for delivery.      Physical Exam  Constitutional: She is oriented to person, place, and time. She appears well-developed and well-nourished.  Cardiovascular: Normal rate and regular rhythm.  Respiratory: Effort normal.  GI:  Gravid, + for UCs  Genitourinary:    Vulva normal.   Neurological: She is alert and oriented to person, place, and time.  Skin: Skin is warm and dry.  Psychiatric: She has a normal mood and affect. Her behavior is normal.    Prenatal labs: ABO, Rh: O/Positive/-- (05/06 0000) Antibody: Negative (05/06 0000) Rubella: Immune (05/06 0000) RPR: Non Reactive (08/25 1104)  HBsAg: Negative (05/06 0000)  HIV: Non reactive (08/25 0000)  GBS: Negative/-- (10/28 1400)   Assessment: [redacted]w[redacted]d weeks gestation 1 stage of labor FHR category 1   Plan:     Admit for active labor Labs pending Epidural if desired Continuous fetal monitoring   1. Fetal Well being  - Fetal Tracing: Cat I - Ultrasound: reviewed, as above - Group B Streptococcus: Neg - Presentation: vtx confirmed by Leopolds and sutures   2. Routine OB: - Prenatal labs reviewed, as above - Rh O positive  3. Post Partum Planning: - Infant feeding: Both - Contraception: condoms  Janice Berry 04/28/2019, 12:30 PM

## 2019-04-28 NOTE — Discharge Summary (Addendum)
Obstetrical Discharge Summary  Patient Name: Janice Berry DOB: 09/25/1990 MRN: [id number]  Date of Admission: 04/28/2019 Date of Discharge: 04/29/2019  Primary OB:  [facility]   Gestational Age at Delivery: [redacted]w[redacted]d   Antepartum complications:  - Hx of premature delivery - hx of son with hearing loss  - abnormal glucola, 3hr wnl - normal anatomy scan   Admitting Diagnosis: active labor Secondary Diagnosis: Patient Active Problem List   Diagnosis Date Noted  . Uses Spanish as primary spoken language 04/29/2019  . NSVD (normal spontaneous vaginal delivery) 04/28/2019  . Family history of deafness--son with neuro deafness with hearing aids in both ears 04/04/2019  . Vaginal discharge during pregnancy--BV 03/30/2019  . Abnormal glucose tolerance test in pregnancy, antepartum 02/01/2019  . Anemia affecting pregnancy, antepartum 01/30/2019  . Gastroesophageal reflux disease without esophagitis 01/30/2019  . History of 2019 novel coronavirus disease (COVID-19) 11/29/2018  . Supervision of other normal pregnancy, antepartum 11/29/2018  . Other symptoms and signs involving emotional state 11/29/2018    Augmentation: none Complications: None Intrapartum complications/course: 28yo [id number] at 39+4wks presenting with active labor, progressed quickly to 8cm dilated in triage. SROM for clear fluid while moving her to a labor room, where she pushed over an intact perineum and delivered the fetal head, followed promptly by the shoulders. She was in control the whole time, and the baby placed on the maternal abdomen. Delayed cord clamping and the FOB cut his cord, while he was skin to skin. The placenta delivered spontaneously and intact, with trailing membranes. Small infraclitoral laceration that was hemostatic and did not require a stitch. Mom and baby tolerated the procedure well.  Date of Delivery: 04/28/2019 Delivered By: Christeen Douglas, with assistance by J. Marcquis Ridlon for the precipitous  delivery Delivery Type: spontaneous vaginal delivery Anesthesia: none Placenta: Spontaneous Laceration: none Episiotomy: none Newborn Data: Live born female "Zabiel" Birth Weight:  pending APGAR: 8, 9  Newborn Delivery   Birth date/time: 04/28/2019 13:27:00 Delivery type: Vaginal, Spontaneous       Brief Hospital Course  Janice Berry is a [id number] who had a SVD on 04/28/2019;  for further details of this delivery, please refer to the delivery note.  Patient had an uncomplicated postpartum course.  By time of discharge on PPD#1, her pain was controlled on oral pain medications; she had appropriate lochia and was ambulating, voiding without difficulty and tolerating regular diet.  She was deemed stable for discharge to home.    Discharge Physical Exam:  BP 130/83 (BP Location: Right Arm)   Pulse 82   Temp 97.7 F (36.5 C) (Oral)   Resp 18   Ht 5' 0.63" (1.54 m)   Wt 84.4 kg   SpO2 99% Comment: Room Air  Breastfeeding Unknown   BMI 35.57 kg/m   General: NAD CV: RRR Pulm: CTABL, nl effort ABD: s/nd/nt, fundus firm and below the umbilicus Lochia: minimal DVT Evaluation: LE non-ttp, no evidence of DVT on exam.  Hemoglobin  Date Value Ref Range Status  04/29/2019 9.9 (L) 12.0 - 15.0 g/dL Final  84/66/5993 57.0 (L) 11.1 - 15.9 g/dL Final  17/79/3903 00.9  Final   HCT  Date Value Ref Range Status  04/29/2019 29.2 (L) 36.0 - 46.0 % Final   Hematocrit  Date Value Ref Range Status  01/30/2019 30.5 (L) 34.0 - 46.6 % Final    Post partum course: uncomplicated, Asymptomatic anemia - Rx for Ferrous sulfate BID sent Postpartum Procedures: none Disposition: stable, discharge to home. Baby Feeding: breastmilk  and formula Baby Disposition: home with mom  Rh Immune globulin given: n/a Rubella vaccine given: n/a  Tdap vaccine given in AP or PP setting: Given 01/30/2019 Flu vaccine given in AP or PP setting: Given 03/05/2019  Contraception: condoms  Prenatal  Labs: Blood type/Rh --/--/O POS (11/21 1254)  Antibody screen neg  Rubella Immune  Varicella Immune  RPR NR  HBsAg Neg  HIV NR  GC neg  Chlamydia neg  Genetic screening negative  1 hour GTT 151  3 hour GTT wnl  GBS neg     Plan:  Janice Berry was discharged to home in good condition. Follow-up appointment at [place]  with delivering provider in 6 weeks or with [facility]   Discharge Medications: Allergies as of 04/29/2019   No Known Allergies     Medication List    TAKE these medications   ferrous sulfate 325 (65 FE) MG tablet Take 1 tablet (325 mg total) by mouth 2 (two) times daily with a meal. What changed: when to take this   Prenatal Vitamin 27-0.8 MG Tabs Take 1 tablet by mouth daily.       Follow-up Information    [facility] DEPT. Schedule an appointment as soon as possible for a visit in 6 week(s).   Contact information: [place] [phone]       [facility] Follow up today.   Why: We are also happy to see you at our clinic if you would like. Contact information: [address], [place] .   Contact information: [address] [place] [address] [phone]           Signed: Clydene Laming, CNM 04/29/19 10:59 AM  Interpreter used for discharge Clydene Laming, CNM 04/29/19 11:02 AM

## 2019-04-29 DIAGNOSIS — Z789 Other specified health status: Secondary | ICD-10-CM | POA: Diagnosis present

## 2019-04-29 LAB — SAMPLE TO BLOOD BANK

## 2019-04-29 LAB — CBC
HCT: 29.2 % — ABNORMAL LOW (ref 36.0–46.0)
Hemoglobin: 9.9 g/dL — ABNORMAL LOW (ref 12.0–15.0)
MCH: 31.3 pg (ref 26.0–34.0)
MCHC: 33.9 g/dL (ref 30.0–36.0)
MCV: 92.4 fL (ref 80.0–100.0)
Platelets: 253 10*3/uL (ref 150–400)
RBC: 3.16 MIL/uL — ABNORMAL LOW (ref 3.87–5.11)
RDW: 13.6 % (ref 11.5–15.5)
WBC: 14.7 10*3/uL — ABNORMAL HIGH (ref 4.0–10.5)
nRBC: 0 % (ref 0.0–0.2)

## 2019-04-29 LAB — RPR: RPR Ser Ql: NONREACTIVE

## 2019-04-29 MED ORDER — FERROUS SULFATE 325 (65 FE) MG PO TABS: 325.0000 mg | ORAL_TABLET | Freq: Two times a day (BID) | ORAL | 3 refills | Status: AC

## 2019-04-29 MED ORDER — FERROUS SULFATE 325 (65 FE) MG PO TABS
325.0000 mg | ORAL_TABLET | Freq: Two times a day (BID) | ORAL | Status: DC
  Administered 2019-04-29 – 2019-04-30 (×2): 325 mg via ORAL
  Filled 2019-04-29 (×2): qty 1

## 2019-04-30 NOTE — Progress Notes (Signed)
Patient discharged home with infant. Discharge instructions and prescriptions given and reviewed with patient. Patient verbalized understanding with Spanish interpretor. Escorted out by staff.

## 2019-04-30 NOTE — Lactation Note (Signed)
This note was copied from a baby's chart. Lactation Consultation Note  Patient Name: Janice Berry [id number] Date: 04/30/2019 Reason for consult: Follow-up assessment;Term;Other (Comment)(Tongue tie, Lip tie, Slight receding chin, Off bililites now)  [name] has been on phototherapy for hyberbilirubinemia, but was removed this am d/t decrease in bilirubin level.  Observed mom putting [name] to second breast.  Mom has large filling breasts with large nipples.  Mom reports breasts feel much fuller but softer after breast feeding.  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  Mom is going to follow up at [facility].  Encouraged mom to talk with Pediatrician about tongue tie and lip tie.  [name] latches without assistance from lactation now with strong rhythmic sucking and swallowing.  Mom reports very little discomfort at the breast.  Mom complains more with hand expression than when Denyse Amass is at the breast.  Mom can easily hand express lots of transitional milk.  She has more difficulty expressing milk while pumping, only getting 2 to 5 ml.  Mom has been committed to putting [name] to the breast whenever he demonstrates hunger cues.  Mom has 10 year old at home that she breast fed for one year.  Through [employer], reviewed supply and demand, normal newborn stomach size, routine newborn feeding pattern, breast massage, hand expression, and electric and manual pumping, storage, collection, labeling, cleaning and handling of expressed milk.  Lactation community resources and contact numbers given for further breast feeding assistance.  Lactation name and number written on white board and encouraged to call with any questions, concerns or assistance.   Maternal Data Formula Feeding for Exclusion: No Has patient been taught Hand Expression?: Yes Does the patient have breastfeeding experience prior to this delivery?:  Yes  Feeding Feeding Type: Breast Fed  LATCH Score Latch: Grasps breast easily, tongue down, lips flanged, rhythmical sucking.  Audible Swallowing: Spontaneous and intermittent  Type of Nipple: Everted at rest and after stimulation  Comfort (Breast/Nipple): Filling, red/small blisters or bruises, mild/mod discomfort  Hold (Positioning): No assistance needed to correctly position infant at breast.  LATCH Score: 9  Interventions Interventions: Breast massage;Hand express;Reverse pressure;Breast compression;Support pillows;Position options;Coconut oil  Lactation Tools Discussed/Used WIC Program: No(Inactive MCD) Pump Review: Setup, frequency, and cleaning;Milk Storage;Other (comment) Initiated by:: S.Clevie Prout,RN,BSN,IBCLC Date initiated:: 04/29/19   Consult Status Consult Status: Follow-up Date: 04/30/19 Follow-up type: Call as needed    Jarold Motto 04/30/2019, 9:54 AM

## 2019-04-30 NOTE — Progress Notes (Signed)
Interpretor used for assessment and to update current plan of care for her and infant. Pt verbalized understanding.

## 2019-05-01 ENCOUNTER — Ambulatory Visit: Payer: HRSA Program

## 2019-05-14 ENCOUNTER — Telehealth: Payer: Self-pay | Admitting: Licensed Clinical Social Worker

## 2019-05-14 NOTE — Telephone Encounter (Signed)
LCSW called to conduct postpartum mood check using interpreter - Janice Berry. LCSW conducted brief assessment. Patient denies any current concerns and reports that she is sleeping well, appetite is good, and denies any concerns of mood or anxiety issues. LCSW encouraged patient to reach out to the health department if she experiences any concerns.

## 2019-05-14 NOTE — Telephone Encounter (Signed)
-----   Message from Milton Ferguson, [place] sent at 04/30/2019 11:55 AM EST ----- Regarding: mood check week of 05/14/2019 Referred for postpartum mood check- delivered 11/21.

## 2019-06-15 ENCOUNTER — Other Ambulatory Visit: Payer: Self-pay

## 2019-06-15 ENCOUNTER — Encounter: Payer: Self-pay | Admitting: Family Medicine

## 2019-06-15 ENCOUNTER — Ambulatory Visit: Payer: Self-pay | Admitting: Family Medicine

## 2019-06-15 DIAGNOSIS — N8189 Other female genital prolapse: Secondary | ICD-10-CM

## 2019-06-15 LAB — HEMOGLOBIN, FINGERSTICK: Hemoglobin: 12.9 g/dL (ref 11.1–15.9)

## 2019-06-15 MED ORDER — THERA VITAL M PO TABS: 1.0000 | ORAL_TABLET | Freq: Every day | ORAL | 3 refills | Status: AC

## 2019-06-15 NOTE — Progress Notes (Signed)
Post Partum Exam  Janice Berry is a 29 y.o. [phone] female who presents for a postpartum visit. She is 7 weeks postpartum following a spontaneous vaginal delivery. I have fully reviewed the prenatal and intrapartum course. The delivery was at [redacted]w[redacted]d gestational weeks.  Anesthesia: none. Postpartum course has been going well. Baby's course has been going well. Baby is feeding by Bottle and Breast Bleeding no bleeding. Bowel function is normal. Bladder function is normal,occ leaks urine. Patient is not sexually active. Contraception method is condoms.   She would like a check of how she is healing, feels like her vaginal opening is larger now. Denies pain, tenderness or bleeding. Per hosp note she had small infraclitoral laceration that was hemostatic and did not require a stitch.  Endorses leaking urine w/cough/sneeze. Sometimes has a heaviness in vagina since giving birth.  Postpartum depression screening: score 0 Edinburgh Postnatal Depression Scale - 06/15/19 1557      Edinburgh Postnatal Depression Scale:  In the Past 7 Days   I have been able to laugh and see the funny side of things.  0    I have looked forward with enjoyment to things.  0    I have blamed myself unnecessarily when things went wrong.  0    I have been anxious or worried for no good reason.  0    I have felt scared or panicky for no good reason.  0    Things have been getting on top of me.  0    I have been so unhappy that I have had difficulty sleeping.  0    I have felt sad or miserable.  0    I have been so unhappy that I have been crying.  0        The following portions of the patient's history were reviewed and updated as appropriate: allergies, current medications, past family history, past medical history, past social history, past surgical history and problem list. Last pap smear is due 2023 per RN note  Review of Systems Pertinent items noted in HPI and remainder of comprehensive ROS otherwise  negative.    Objective:  BP 122/83   Ht 5' 2.75" (1.594 m)   Wt 170 lb 12.8 oz (77.5 kg)   Breastfeeding Yes Comment: No menses yet  BMI 30.50 kg/m   Gen: well appearing, NAD HEENT: no scleral icterus CV: RR Lung: Normal WOB Breast:performed-not indicated  Ext: warm well perfused  Pelvic exam: External: Labia normal appearance, well healed scar R lower labia, nontender. Internal: weak pelvic muscles, grade 1 cystocele, no other masses, no tenderness, no discharge. Rectal: performed -  not indicated       Assessment:    Normal postpartum exam w/pelvic floor laxity. Pap smear not done at today's visit.   Plan:   1. Contraception: Contraception counseling: Reviewed all forms of birth control options in the tiered based approach. available including abstinence; over the counter/barrier methods; hormonal contraceptive medication including pill, patch, ring, injection,contraceptive implant; hormonal and nonhormonal IUDs; permanent sterilization options including vasectomy and the various tubal sterilization modalities. Risks, benefits, and typical effectiveness rates were reviewed.  Questions were answered.  Written information was also given to the patient to review.  Patient desires condoms. She will follow up in  1 year for wellness exam.  She was told to call with any further questions, or with any concerns about this method of contraception.  Emphasized use of condoms 100% of the time for STI  prevention.  ECP n/a  Recommended delay in pregnancy for 18 months   2. Infant feeding:  patient is currently feeding with breast and bottle.  If breastmilk feeding patient was given letter for employer to provide appropriate pumping time to express breastmilk.   -Recommended patient engage with WIC/BFpeer counselors  -Counseled to sign new child up for [facility] services 3. Mood: EPDS is low risk. Reviewed resources and that mood sx in first year after pregnancy are considered related to pregnancy  and to reach out for help at [facility] if needed. Discussed [facility] as link to care and availability of LCSW for counseling  4. Chronic Medical Conditions:  Anemia: resolved, hgb 12.9 today  1. Postpartum exam Plan as above.  - Multiple Vitamins-Minerals (MULTIVITAMIN) tablet; Take 1 tablet by mouth daily.  Dispense: 30 tablet; Refill: 3 - Hemoglobin, venipuncture  2. Pelvic floor relaxation Exam with grade 1 cystocele, weakened pelvic muscles. Discussed kegel exercises as well as pelvic PT. As pt does not have insurance will start with kegels, handout given. Advised to RTC or primary care if condition worsens for referral to pelvic PT.   Patient given handout about PCP care in the community Given MVI per family planning program guidelines and availability  Follow up in: 1 year or as needed.

## 2019-06-15 NOTE — Progress Notes (Signed)
In for PP visit; primary care list given;MVI given; declines HIV/RPR testing Sharlette Dense, RN

## 2019-12-26 ENCOUNTER — Other Ambulatory Visit: Payer: Self-pay

## 2019-12-26 ENCOUNTER — Ambulatory Visit (LOCAL_COMMUNITY_HEALTH_CENTER): Payer: HRSA Program | Admitting: Advanced Practice Midwife

## 2019-12-26 ENCOUNTER — Encounter: Payer: Self-pay | Admitting: Advanced Practice Midwife

## 2019-12-26 VITALS — BP 124/74 | Ht 62.34 in | Wt 164.2 lb

## 2019-12-26 DIAGNOSIS — Z30017 Encounter for initial prescription of implantable subdermal contraceptive: Secondary | ICD-10-CM

## 2019-12-26 DIAGNOSIS — Z3009 Encounter for other general counseling and advice on contraception: Secondary | ICD-10-CM

## 2019-12-26 DIAGNOSIS — E663 Overweight: Secondary | ICD-10-CM

## 2019-12-26 LAB — PREGNANCY, URINE: Preg Test, Ur: NEGATIVE

## 2019-12-26 NOTE — Progress Notes (Signed)
Here today for birth control. Interested in [place]. Last PE here was a PP exam 06/15/2019. Last pap smear was 10/12/2018. Declines STD screening. Tawny Hopping, RN

## 2019-12-26 NOTE — Progress Notes (Signed)
   [facility]  Subjective:  Janice Berry is a 29 y.o. MHF G4P2 nonsmoker being seen today for interested in Nexplanon  Chief Complaint  Patient presents with  . Contraception    HPI No menses since birth of baby 8 mo ago.  Breastfeeding (3x/day) and bottlefeeding 41 month old child. Last pap 10/12/2018 neg.  Last sex 12/23/19 with condom; with current partner x 6 years. Last PE 06/15/2019  Does the patient have a current or past history of drug use? No   No components found for: HCV]   Health Maintenance Due  Topic Date Due  . PAP SMEAR-Modifier  Never done  . Hepatitis C Screening  Never done  . PAP-Cervical Cytology Screening  11/04/2017    ROS  The following portions of the patient's history were reviewed and updated as appropriate: allergies, current medications, past family history, past medical history, past social history, past surgical history and problem list. Problem list updated.   See flowsheet for other program required questions.  Objective:   Vitals:   12/26/19 1317  BP: 124/74  Weight: 164 lb 3.2 oz (74.5 kg)  Height: 5' 2.34" (1.583 m)    Physical Exam  n/a  Assessment and Plan:  Janice Berry is a 29 y.o. female presenting to the [facility] Department for a Women's Health problem visit  1. Family planning  - Pregnancy, urine--neg  2. Encounter for initial prescription of implantable subdermal contraceptive Pt was not scheduled for Nexplanon insertion (provider unable to insert today) and agrees to return tomorrow and be abstinant until then     No follow-ups on file.  Future Appointments  Date Time Provider Department Center  12/28/2019  3:40 PM [facility] PROVIDER [facility] None    Alberteen Spindle, CNM

## 2019-12-28 ENCOUNTER — Ambulatory Visit (LOCAL_COMMUNITY_HEALTH_CENTER): Payer: Self-pay | Admitting: Family Medicine

## 2019-12-28 ENCOUNTER — Other Ambulatory Visit: Payer: Self-pay

## 2019-12-28 ENCOUNTER — Encounter: Payer: Self-pay | Admitting: Family Medicine

## 2019-12-28 VITALS — BP 109/79 | Ht 62.34 in | Wt 166.0 lb

## 2019-12-28 DIAGNOSIS — Z3009 Encounter for other general counseling and advice on contraception: Secondary | ICD-10-CM

## 2019-12-28 DIAGNOSIS — Z30017 Encounter for initial prescription of implantable subdermal contraceptive: Secondary | ICD-10-CM

## 2019-12-28 MED ORDER — ETONOGESTREL 68 MG ~~LOC~~ IMPL
68.0000 mg | DRUG_IMPLANT | Freq: Once | SUBCUTANEOUS | Status: AC
  Administered 2019-12-28: 68 mg via SUBCUTANEOUS

## 2019-12-28 MED ORDER — MULTI-VITAMIN/MINERALS PO TABS: 1.0000 | ORAL_TABLET | Freq: Every day | ORAL | 0 refills | Status: AC

## 2019-12-28 NOTE — Progress Notes (Signed)
Nexplanon inserted  By Dr. Alvester Morin into left arm. Nexplanon consents signed, Nexplanon Instruction sheet given and questions answered. Condoms given. Jossie Ng, RN

## 2019-12-28 NOTE — Progress Notes (Signed)
HPI: Janice Berry is a [id number] here for Nexplanon UPT on 7/21 was negative Last sex 12/24/2019 with condom No LMP since delivery Currently breastfeeding and formula feeding her 9 mon old infant.   BP 109/79   Ht 5' 2.34" (1.583 m)   Wt 166 lb (75.3 kg)   LMP  (LMP Unknown)   Breastfeeding Yes Comment: No menses since delivery  BMI 30.03 kg/m   Gen: well appearing, NAD HEENT: no scleral icterus CV: RR Lung: Normal WOB Ext: warm well perfused  Nexplanon Insertion Procedure Patient identified, informed consent performed, consent signed.   Patient does understand that irregular bleeding is a very common side effect of this medication. She was advised to have backup contraception after placement. Patient was determined to meet WHO criteria for not being pregnant. Appropriate time out taken.  The insertion site was identified 8-10 cm (3-4 inches) from the medial epicondyle of the humerus and 3-5 cm (1.25-2 inches) posterior to (below) the sulcus (groove) between the biceps and triceps muscles of the patient's left arm and marked.  Patient was prepped with alcohol swab and then injected with 3 ml of 1% lidocaine.  Arm was prepped with chlorhexidene, Nexplanon removed from packaging,  Device confirmed in needle, then inserted full length of needle and withdrawn per handbook instructions. Nexplanon was able to palpated in the patient's arm; patient palpated the insert herself. There was minimal blood loss.   Patient insertion site covered with guaze and a pressure bandage to reduce any bruising.  The patient tolerated the procedure well and was given post procedure instructions.  Recommended patient take home UPT in 10-14 days to assure she is not pregnant. She voiced understanding. Counseled about up to 4 years of continuous use with ability to remove prior if desires a pregnancy.

## 2020-03-08 IMAGING — US US MFM OB DETAIL +14 WK
1 series · 12 of 28 positions shown · non-contrast
Comparison: none

PATIENT INFO:

             NAEEMA
PERFORMED BY:
                                                            MAJORO
SERVICE(S) PROVIDED:
 ----------------------------------------------------------------------
INDICATIONS:
  20 weeks gestation of pregnancy
FETAL EVALUATION:
 Num Of Fetuses:         1
 Fetal Heart Rate(bpm):  145
 Cardiac Activity:       Present
 Presentation:           Transverse, head to maternal left
 Placenta:               Anterior , fundal
 AFI Sum(cm)     %Tile       Largest Pocket(cm)
 3.86            < 3
 RUQ(cm)
BIOMETRY:
 BPD:        47  mm     G. Age:  20w 1d         23  %    CI:        67.88   %    70 - 86
                                                         FL/HC:      19.5   %    15.9 -
 HC:      182.5  mm     G. Age:  20w 5d         32  %    HC/AC:      1.13        1.06 -
 AC:       161   mm     G. Age:  21w 1d         54  %    FL/BPD:     75.5   %
 FL:       35.5  mm     G. Age:  21w 1d         54  %    FL/AC:      22.0   %    20 - 24
 CER:        21  mm     G. Age:  20w 0d         31  %
 NFT:       4.5  mm
 Est. FW:     400  gm    0 lb 14 oz      56  %
GESTATIONAL AGE:
 LMP:           22w 4d        Date:  07/13/18                 EDD:   04/19/19
 Clinical EDD:  20w 4d                                        EDD:   05/03/19
 U/S Today:     20w 6d                                        EDD:   05/01/19
 Best:          20w 6d     Det. By:  U/S (12/18/18)           EDD:   05/01/19
ANATOMY:
 Cranium:               Within Normal Limits   Aortic Arch:            Normal appearance
 Cavum:                 CSP visualized         Ductal Arch:            Normal appearance
 Ventricles:            Normal appearance      Diaphragm:              Within Normal Limits
 Choroid Plexus:        Within Normal Limits   Stomach:                Seen
 Cerebellum:            Within Normal Limits   Abdomen:                Within Normal
                                                                       Limits
 Posterior Fossa:       Within Normal Limits   Abdominal Wall:         Normal appearance
 Nuchal Fold:           Within Normal Limits   Cord Vessels:           3 vessels
 Face:                  Orbits visualized      Kidneys:                Normal appearance
 Lips:                  Normal appearance      Bladder:                Seen
 Thoracic:              Within Normal Limits   Spine:                  Normal appearance
 Heart:                 4-Chamber view         Upper Extremities:      Visualized
                        appears normal
 RVOT:                  Normal appearance      Lower Extremities:      Visualized
 LVOT:                  Normal appearance
CERVIX UTERUS ADNEXA:
 Cervix
 Length:           3.91  cm.

[Series 1: us mfm ob detail +14 wk · 0.25mm/px · 12 of 79 slices shown]
[im 3/79]
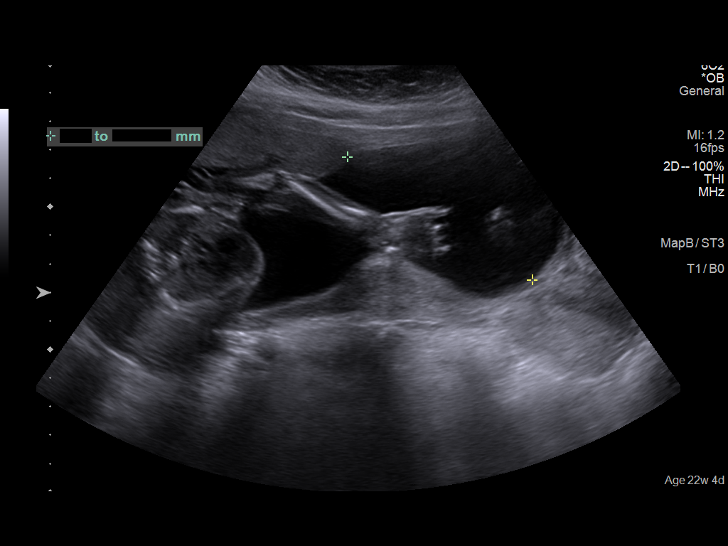
[im 9/79]
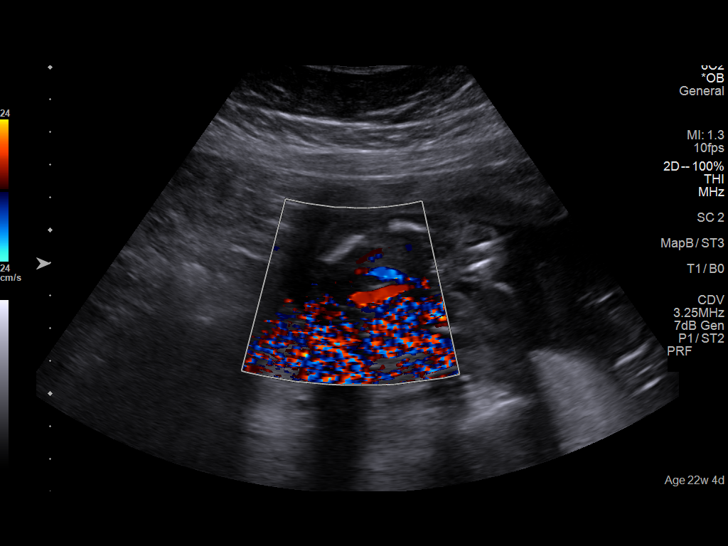
[im 15/79]
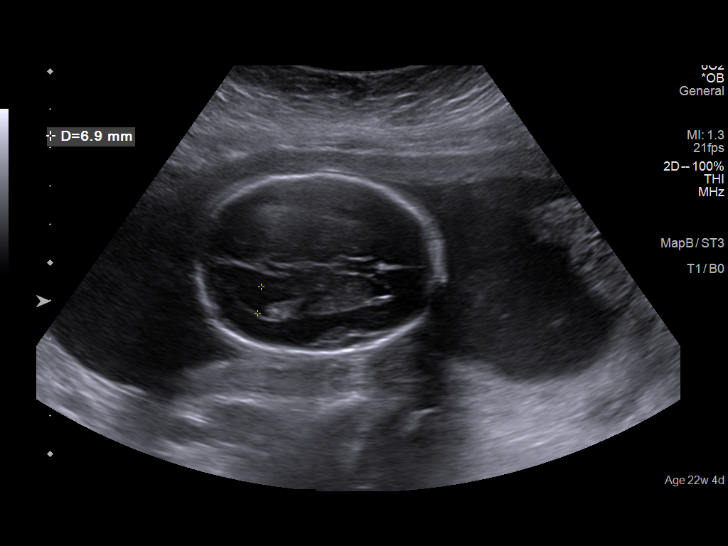
[im 24/79]
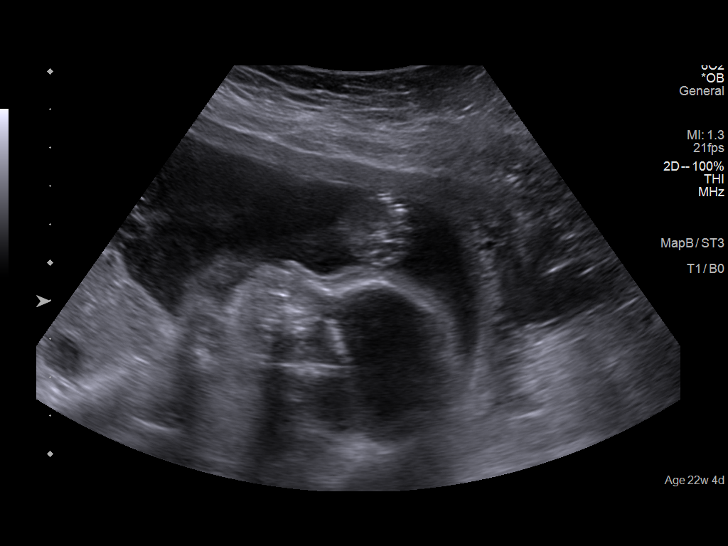
[im 29/79]
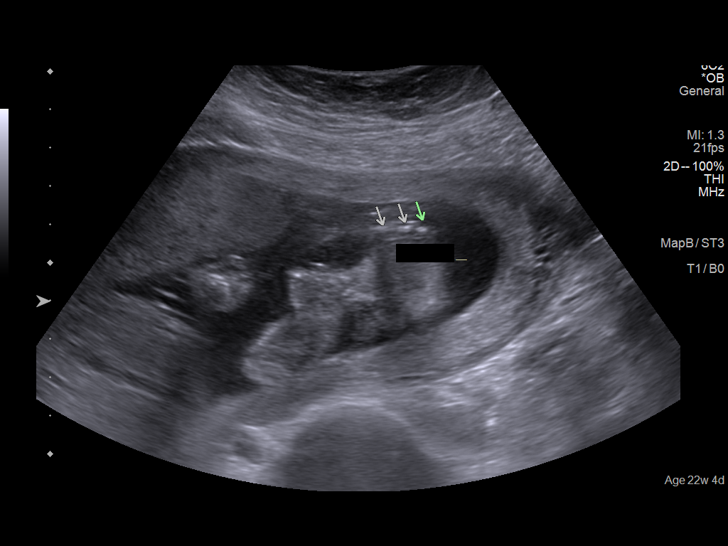
[im 35/79]
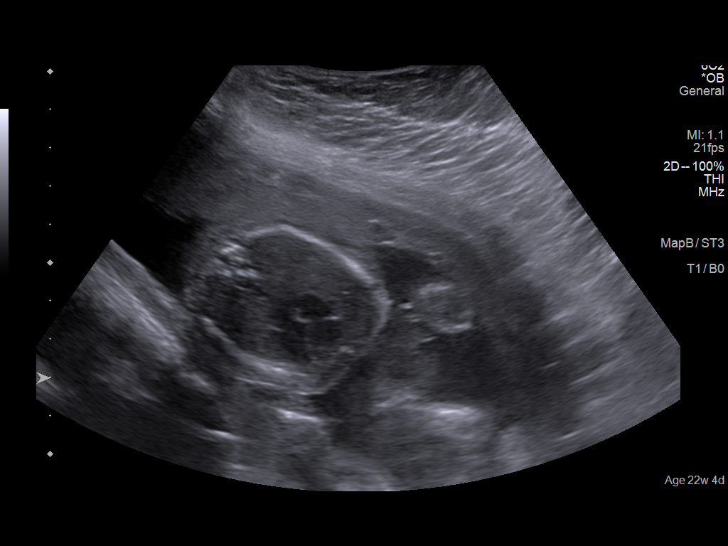
[im 44/79]
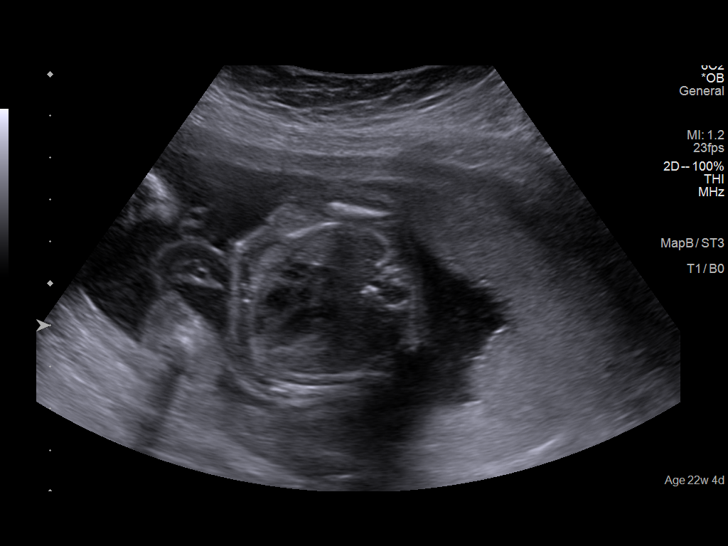
[im 50/79]
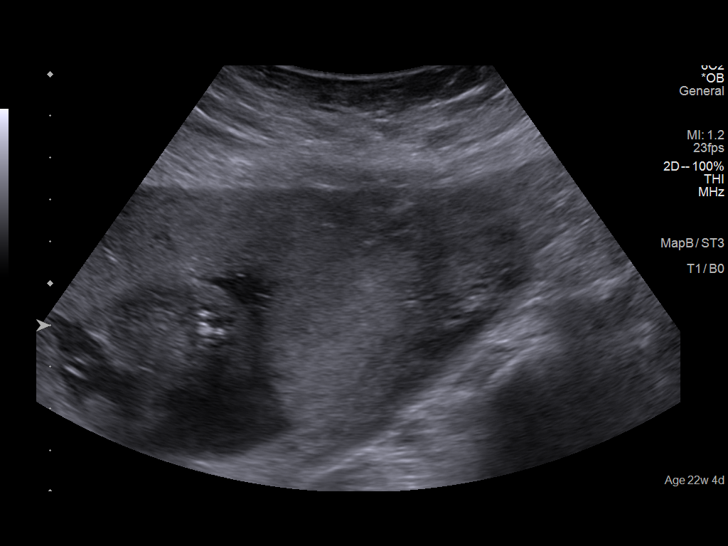
[im 55/79]
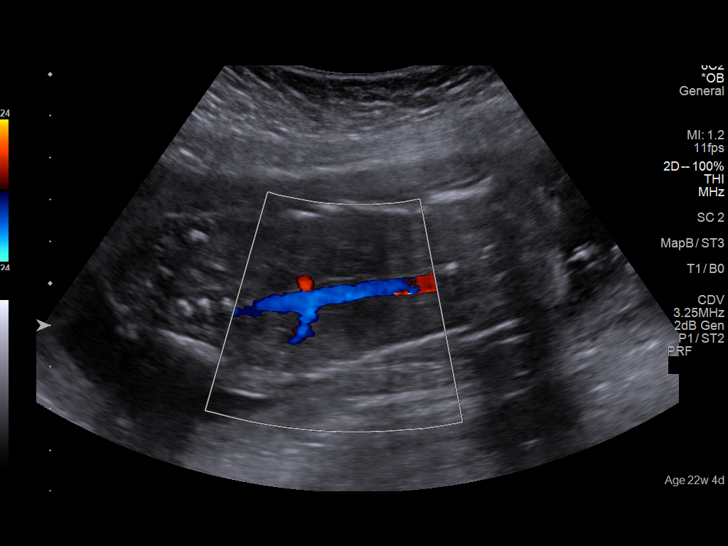
[im 64/79]
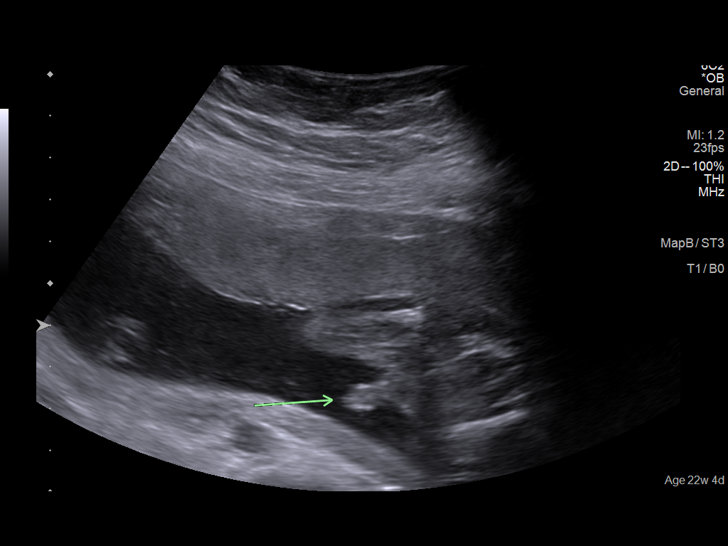
[im 70/79]
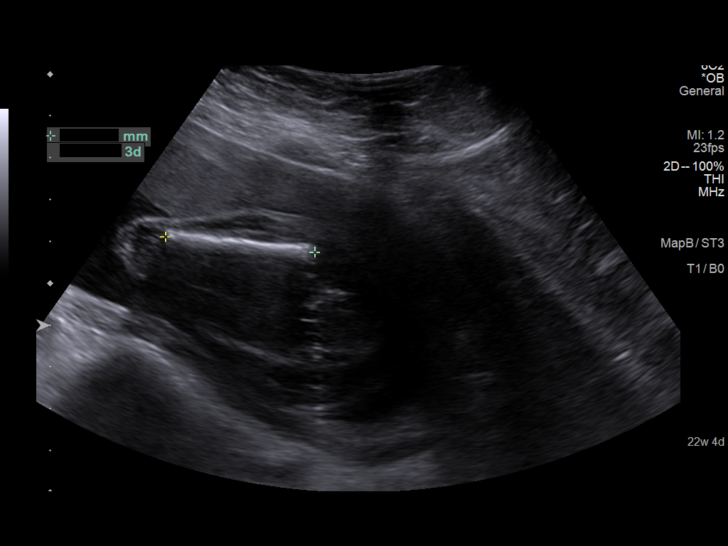
[im 76/79]
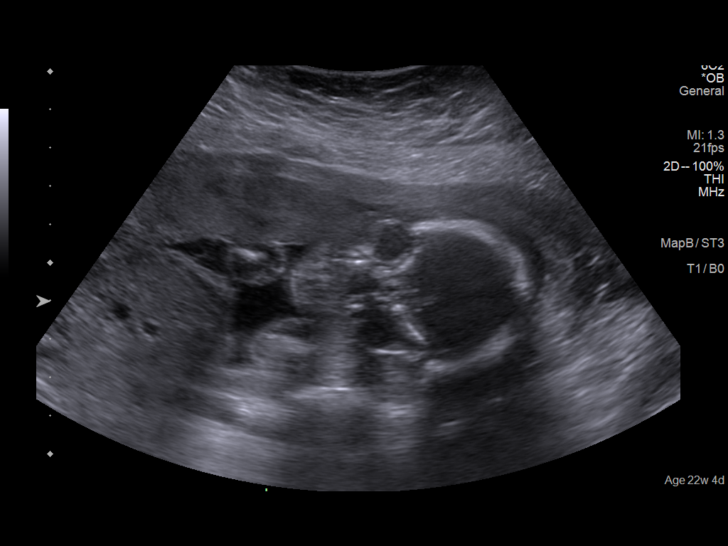

[12 of 28 positions shown; findings below may reference images not displayed]

IMPRESSION: Dear Dr.   MAJORO,

 Thank you for referring your patient for fetal anatomic survey.
 She had genetic counseling and negative cffDNA screening.
 She tested positive for COVID 11/19/18 and is without
 symptoms and beyond her home isolation period.

 Ultrasound demonstrates  a singleton gestation with
 subjectively normal amniotic fluid volume.  Dating is by
 earliest available ultrasound (due to unsure LMP) performed
 at [HOSPITAL] (ED visit for bleeding) on 09/19/18; mesurements were
 consistent with 8 weeks 0 days and EDD of 05/01/19.

 The fetal biometry correlates with established dating.

 The fetal anatomical survey appears unremarkable.

 Findings were reviewed with the assistance of a Spanish
 interpreter. She was counseled COVID 19 is not associated
 with teratogenicity or abnormalities in fetal growth.

 Thank you for allowing us to participate in your patient's care.

 assistance.

                  Jumper, Woody

## 2022-12-21 ENCOUNTER — Ambulatory Visit (LOCAL_COMMUNITY_HEALTH_CENTER): Payer: Self-pay | Admitting: Family Medicine

## 2022-12-21 ENCOUNTER — Encounter: Payer: Self-pay | Admitting: Family Medicine

## 2022-12-21 VITALS — BP 120/74 | HR 86 | Wt 191.2 lb

## 2022-12-21 DIAGNOSIS — Z3009 Encounter for other general counseling and advice on contraception: Secondary | ICD-10-CM

## 2022-12-21 DIAGNOSIS — Z113 Encounter for screening for infections with a predominantly sexual mode of transmission: Secondary | ICD-10-CM

## 2022-12-21 DIAGNOSIS — Z01419 Encounter for gynecological examination (general) (routine) without abnormal findings: Secondary | ICD-10-CM

## 2022-12-21 LAB — WET PREP FOR TRICH, YEAST, CLUE
Trichomonas Exam: NEGATIVE
Yeast Exam: NEGATIVE

## 2022-12-21 LAB — HM HIV SCREENING LAB: HM HIV Screening: NEGATIVE

## 2022-12-21 NOTE — Progress Notes (Signed)
Pt is here for family planning visit. Family planning packet reviewed and given to pt. Condoms given. Wet prep results reviewed with pt, no treatment required per standing order. Gaspar Garbe, RN

## 2022-12-21 NOTE — Progress Notes (Signed)
[facility] DEPARTMENT [facility] [address] Main Number: [phone]  Family Planning Visit- Repeat Yearly Visit  Subjective:  Janice Berry is a 32 y.o. [id number]  being seen today for an annual wellness visit and to discuss contraception options.   The patient is currently using Hormonal Implant for pregnancy prevention. Patient does not want a pregnancy in the next year.   she/her/hers report they are looking for a method that provides High efficacy at preventing pregnancy   Patient has the following medical problems: has Family history of deafness--son with neuro deafness with hearing aids in both ears and Overweight BMI=29.7 on their problem list.  Chief Complaint  Patient presents with   Annual Exam    Patient reports to clinic for PE and pap smear. Last had pap 10/2018, repeat today. Has Nexplanon in place- good for 1 more year  Patient denies concerns about self   See flowsheet for other program required questions.   Body mass index is 34.59 kg/m. - Patient is eligible for diabetes screening based on BMI> 25 and age >35?  no HA1C ordered? not applicable  Patient reports 1 of partners in last year. Desires STI screening?  Yes   Has patient been screened once for HCV in the past?  No  No results found for: "HCVAB"  Does the patient have current of drug use, have a partner with drug use, and/or has been incarcerated since last result? No  If yes-- Screen for HCV through [facility]   Does the patient meet criteria for HBV testing? No  Criteria:  -Household, sexual or needle sharing contact with HBV -History of drug use -HIV positive -Those with known Hep C   Health Maintenance Due  Topic Date Due   Hepatitis C Screening  Never done   PAP SMEAR-Modifier  10/12/2019   COVID-19 Vaccine (1 - 2023-24 season) Never done    Review of Systems  Constitutional:  Negative for weight loss.  Eyes:  Negative for blurred  vision.  Respiratory:  Negative for cough and shortness of breath.   Cardiovascular:  Negative for claudication.  Gastrointestinal:  Negative for nausea.  Genitourinary:  Negative for dysuria and frequency.  Skin:  Negative for rash.  Neurological:  Negative for headaches.  Endo/Heme/Allergies:  Does not bruise/bleed easily.    The following portions of the patient's history were reviewed and updated as appropriate: allergies, current medications, past family history, past medical history, past social history, past surgical history and problem list. Problem list updated.  Objective:   Vitals:   12/21/22 1429  BP: 120/74  Pulse: 86  Weight: 191 lb 3.2 oz (86.7 kg)    Physical Exam Vitals and nursing note reviewed.  Constitutional:      Appearance: Normal appearance.  HENT:     Head: Normocephalic and atraumatic.     Mouth/Throat:     Mouth: Mucous membranes are moist.     Pharynx: Oropharynx is clear. No oropharyngeal exudate or posterior oropharyngeal erythema.  Pulmonary:     Effort: Pulmonary effort is normal.  Abdominal:     General: Abdomen is flat.     Palpations: There is no mass.     Tenderness: There is no abdominal tenderness. There is no rebound.  Genitourinary:    General: Normal vulva.     Exam position: Lithotomy position.     Pubic Area: No rash or pubic lice.      Labia:  Right: No rash or lesion.        Left: No rash or lesion.      Vagina: Vaginal discharge present. No erythema, bleeding or lesions.     Cervix: Friability present. No cervical motion tenderness, discharge, lesion or erythema.     Uterus: Normal.      Adnexa: Right adnexa normal and left adnexa normal.     Rectum: Normal.     Comments: pH = 4-5  Small amt of thick white discharge present Lymphadenopathy:     Head:     Right side of head: No preauricular or posterior auricular adenopathy.     Left side of head: No preauricular or posterior auricular adenopathy.     Cervical:  No cervical adenopathy.     Upper Body:     Right upper body: No supraclavicular, axillary or epitrochlear adenopathy.     Left upper body: No supraclavicular, axillary or epitrochlear adenopathy.     Lower Body: No right inguinal adenopathy. No left inguinal adenopathy.  Skin:    General: Skin is warm and dry.     Findings: No rash.  Neurological:     Mental Status: She is alert and oriented to person, place, and time.       Assessment and Plan:  Janice Berry is a 32 y.o. female [phone] presenting to the [facility] Department for an yearly wellness and contraception visit  1. Well woman exam with routine gynecological exam -repeat pap today  - IGP, Aptima HPV  2. Screening for venereal disease -reports some discharge and itching  - Chlamydia/Gonorrhea [place] [facility] - HIV [place] [facility] - Syphilis Serology, [place] [facility] - WET PREP FOR TRICH, YEAST, CLUE  3. Family planning -Nexplanon placed 12/2019, ok to continue until 2025  Contraception counseling: Reviewed options based on patient desire and reproductive life plan. Patient is interested in Hormonal Implant. This was not provided to the patient today. Already in place  Risks, benefits, and typical effectiveness rates were reviewed.  Questions were answered.  Written information was also given to the patient to review.    The patient will follow up in  1 years for surveillance.  The patient was told to call with any further questions, or with any concerns about this method of contraception.  Emphasized use of condoms 100% of the time for STI prevention.  Educated on ECP and assessed need for ECP. Not indicated- covered by Nexplanon   Return in about 1 year (around 12/21/2023) for annual well-woman exam.  No future appointments. Due to language barrier, interpreter Estill Dooms was present for this visit.  Lenice Llamas, [place]

## 2022-12-24 LAB — IGP, APTIMA HPV
HPV Aptima: NEGATIVE
PAP Smear Comment: 0

## 2024-07-12 ENCOUNTER — Emergency Department: Payer: Self-pay

## 2024-07-12 ENCOUNTER — Other Ambulatory Visit: Payer: Self-pay

## 2024-07-12 ENCOUNTER — Emergency Department
Admission: EM | Admit: 2024-07-12 | Discharge: 2024-07-13 | Disposition: A | Payer: Self-pay | Source: Home / Self Care | Attending: Emergency Medicine | Admitting: Emergency Medicine

## 2024-07-12 DIAGNOSIS — N200 Calculus of kidney: Secondary | ICD-10-CM

## 2024-07-12 DIAGNOSIS — N201 Calculus of ureter: Secondary | ICD-10-CM

## 2024-07-12 DIAGNOSIS — N390 Urinary tract infection, site not specified: Secondary | ICD-10-CM

## 2024-07-12 LAB — URINALYSIS, ROUTINE W REFLEX MICROSCOPIC
Bilirubin Urine: NEGATIVE
Glucose, UA: NEGATIVE mg/dL
Ketones, ur: 5 mg/dL — AB
Nitrite: POSITIVE — AB
Protein, ur: 30 mg/dL — AB
RBC / HPF: >50 RBC/hpf (ref 0–5)
Specific Gravity, Urine: 1.021 (ref 1.005–1.030)
pH: 6 (ref 5.0–8.0)

## 2024-07-12 LAB — CBC
HCT: 40.2 % (ref 36.0–46.0)
Hemoglobin: 13.6 g/dL (ref 12.0–15.0)
MCH: 31 pg (ref 26.0–34.0)
MCHC: 33.8 g/dL (ref 30.0–36.0)
MCV: 91.6 fL (ref 80.0–100.0)
Platelets: 275 10*3/uL (ref 150–400)
RBC: 4.39 MIL/uL (ref 3.87–5.11)
RDW: 12.7 % (ref 11.5–15.5)
WBC: 13.3 10*3/uL — ABNORMAL HIGH (ref 4.0–10.5)
nRBC: 0 % (ref 0.0–0.2)

## 2024-07-12 LAB — BASIC METABOLIC PANEL WITH GFR
Anion gap: 13 (ref 5–15)
BUN: 10 mg/dL (ref 6–20)
CO2: 23 mmol/L (ref 22–32)
Calcium: 9.6 mg/dL (ref 8.9–10.3)
Chloride: 102 mmol/L (ref 98–111)
Creatinine, Ser: 0.73 mg/dL (ref 0.44–1.00)
GFR, Estimated: >60 mL/min
Glucose, Bld: 122 mg/dL — ABNORMAL HIGH (ref 70–99)
Potassium: 3.9 mmol/L (ref 3.5–5.1)
Sodium: 139 mmol/L (ref 135–145)

## 2024-07-12 LAB — POC URINE PREG, ED: Preg Test, Ur: NEGATIVE

## 2024-07-12 MED ORDER — MORPHINE SULFATE (PF) 4 MG/ML IV SOLN
4.0000 mg | Freq: Once | INTRAVENOUS | Status: AC
  Administered 2024-07-12: 4 mg via INTRAVENOUS
  Filled 2024-07-12: qty 1

## 2024-07-12 MED ORDER — KETOROLAC TROMETHAMINE 30 MG/ML IJ SOLN
15.0000 mg | Freq: Once | INTRAMUSCULAR | Status: AC
  Administered 2024-07-12: 15 mg via INTRAVENOUS
  Filled 2024-07-12: qty 1

## 2024-07-12 MED ORDER — ONDANSETRON HCL 4 MG/2ML IJ SOLN
4.0000 mg | INTRAMUSCULAR | Status: AC
  Administered 2024-07-12: 4 mg via INTRAVENOUS
  Filled 2024-07-12: qty 2

## 2024-07-12 MED ORDER — IOHEXOL 300 MG/ML  SOLN
100.0000 mL | Freq: Once | INTRAMUSCULAR | Status: AC | PRN
  Administered 2024-07-13: 100 mL via INTRAVENOUS

## 2024-07-12 NOTE — ED Provider Notes (Signed)
 "  [facility] Provider Note    Event Date/Time   First MD Initiated Contact with Patient 07/12/24 2315     (approximate)   History   Flank Pain (/)  The patient and/or family speak(s) Spanish.  They understand they have the right to the use of a hospital interpreter, however at this time they prefer to speak directly with me in Spanish.  They know that they can ask for an interpreter at any time.   HPI Janice Berry is a 34 y.o. female who presents for evaluation of worsening right sided abdominal and flank pain.  She started having pain about a day ago but it was milder.  She was also having some burning and increased urinary frequency.  She went to the doctor today and was diagnosed with a UTI and started on antibiotics.  However despite the antibiotics, her pain got much worse tonight and she has had nausea, vomiting, and severe waves of sharp pulsating pain in her right abdomen that radiates around to the side.  She has never had anything like this before and said that it hurts more than when she had her children.     Physical Exam   Triage Vital Signs: ED Triage Vitals  Encounter Vitals Group     BP 07/12/24 2038 (!) 164/92     Girls Systolic BP Percentile --      Girls Diastolic BP Percentile --      Boys Systolic BP Percentile --      Boys Diastolic BP Percentile --      Pulse Rate 07/12/24 2038 90     Resp 07/12/24 2038 20     Temp 07/12/24 2038 98.5 F (36.9 C)     Temp Source 07/12/24 2038 Oral     SpO2 07/12/24 2038 100 %     Weight 07/12/24 2042 83.9 kg (185 lb)     Height 07/12/24 2042 1.575 m (5' 2)     Head Circumference --      Peak Flow --      Pain Score 07/12/24 2042 10     Pain Loc --      Pain Education --      Exclude from Growth Chart --     Most recent vital signs: Vitals:   07/12/24 2038  BP: (!) 164/92  Pulse: 90  Resp: 20  Temp: 98.5 F (36.9 C)  SpO2: 100%    General: Awake, alert, conversant, but  in severe distress initially, much improved after pain medication. CV:  Good peripheral perfusion.  Borderline tachycardia, regular rhythm Resp:  Normal effort. Speaking easily and comfortably, no accessory muscle usage nor intercostal retractions.   Abd:  Soft and nondistended.  Tender to palpation in the right lower quadrant and around in the right flank including with percussion.  Some guarding.   ED Results / Procedures / Treatments   Labs (all labs ordered are listed, but only abnormal results are displayed) Labs Reviewed  URINALYSIS, ROUTINE W REFLEX MICROSCOPIC - Abnormal; Notable for the following components:      Result Value   Color, Urine AMBER (*)    APPearance CLEAR (*)    Hgb urine dipstick MODERATE (*)    Ketones, ur 5 (*)    Protein, ur 30 (*)    Nitrite POSITIVE (*)    Leukocytes,Ua TRACE (*)    Bacteria, UA RARE (*)    All other components within normal limits  BASIC METABOLIC PANEL  WITH GFR - Abnormal; Notable for the following components:   Glucose, Bld 122 (*)    All other components within normal limits  CBC - Abnormal; Notable for the following components:   WBC 13.3 (*)    All other components within normal limits  URINE CULTURE  POC URINE PREG, ED     RADIOLOGY See ED course for details   PROCEDURES:  Critical Care performed: No  Procedures    IMPRESSION / MDM / ASSESSMENT AND PLAN / ED COURSE  I reviewed the triage vital signs and the nursing notes.                              Differential diagnosis includes, but is not limited to, UTI/pyelonephritis, ureteral stone, abscess, appendicitis, ovarian cyst/torsion.  Patient's presentation is most consistent with acute presentation with potential threat to life or bodily function.  Labs/studies ordered (see ED course for additional labs and studies that may have been added later): CT of the abdomen and pelvis, CBC, BMP, urine pregnancy test, urinalysis, urine  culture  Interventions/Medications given:  Medications  morphine  (PF) 4 MG/ML injection 4 mg (4 mg Intravenous Given 07/12/24 2351)  ketorolac  (TORADOL ) 30 MG/ML injection 15 mg (15 mg Intravenous Given 07/12/24 2353)  ondansetron  (ZOFRAN ) injection 4 mg (4 mg Intravenous Given 07/12/24 2353)  iohexol  (OMNIPAQUE ) 300 MG/ML solution 100 mL (100 mLs Intravenous Contrast Given 07/13/24 0005)  morphine  (PF) 4 MG/ML injection 4 mg (4 mg Intravenous Given 07/13/24 0027)  cefTRIAXone  (ROCEPHIN ) 1 g in sodium chloride  0.9 % 100 mL IVPB (0 g Intravenous Stopped 07/13/24 0243)  ketorolac  (TORADOL ) 30 MG/ML injection 15 mg (15 mg Intravenous Given 07/13/24 0242)  oxyCODONE -acetaminophen  (PERCOCET/ROXICET) 5-325 MG per tablet 2 tablet (2 tablets Oral Given 07/13/24 0242)    (Note:  hospital course my include additional interventions and/or labs/studies not listed above.)   Patient has severe pain and I ordered morphine  4 mg IV, Toradol  15 mg IV, and Zofran  4 mg IV to begin treatment.  Urinalysis is positive and I ordered ceftriaxone  1 g IV to begin treatment of that as well.  Labs notable for mild leukocytosis of 13.3, normal metabolic panel.  Urine culture pending.  Given the patient's severe pain and right lower quadrant tenderness, I think ureteral colic is most likely but I will evaluate with a CT with IV contrast to also rule out appendicitis and other acute intra-abdominal/pelvic issues     Clinical Course as of 07/13/24 0244  Fri Jul 13, 2024  0021 patient having more pain, ordering another morphine  4 mg IV [CF]  0112 CT ABDOMEN PELVIS W CONTRAST I independently viewed and interpret the patient's CT abdomen pelvis and she has right hydroureteronephrosis with a stone near the UVJ.  Radiology confirmed this stating it is about a 3 mm stone.  Given my concern over the fact the patient has a mild leukocytosis, severe pain, and a UTI, I paged Dr. Francisca with urology to discuss the case.  However the patient is  showing no signs of systemic infection/sepsis.  I reassessed the patient and she is feeling much better.  She can still feel the discomfort but it is much improved from prior. [CF]  0131 Paged Dr. Francisca again [CF]  0202 Paged Dr. Francisca a third time [CF]  0206 I consulted by phone with Dr. Francisca.  We discussed the patient and he is reviewing the patient's data and will let  me know recommendations [CF]  0238 Dr. Francisca advised that he thinks there may be some contamination of the urine and she should be appropriate for discharge and outpatient follow-up.  I reassessed the patient and she is much more comfortable, still having some pain but generally well-controlled.  Continues to have no sign of systemic infection.  She understands and agrees with the plan for outpatient management.  I gave my usual customary follow-up recommendations and return precautions [CF]    Clinical Course User Index [CF] Gordan Huxley, MD     FINAL CLINICAL IMPRESSION(S) / ED DIAGNOSES   Final diagnoses:  Right ureteral stone  Kidney stones  Urinary tract infection with hematuria, site unspecified     Rx / DC Orders   ED Discharge Orders          Ordered    oxyCODONE -acetaminophen  (PERCOCET) 5-325 MG tablet  Every 6 hours PRN        07/13/24 0243    tamsulosin  (FLOMAX ) 0.4 MG CAPS capsule        07/13/24 0243    docusate sodium  (COLACE) 100 MG capsule        07/13/24 0243    cefadroxil  (DURICEF) 500 MG capsule  2 times daily        07/13/24 0243    docusate sodium  (COLACE) 100 MG capsule        07/13/24 0243             Note:  This document was prepared using Dragon voice recognition software and may include unintentional dictation errors.   Gordan Huxley, MD 07/13/24 [phone]  "

## 2024-07-12 NOTE — ED Triage Notes (Signed)
 Pt presents for right sided abdominal pain. Seen and diagnosed today for UTI. Prescribed antibiotics but presenting for pain and nausea control. Prescribed Sulfa. Took Tylenol  around 1200 without relief. Denies vomiting or fevers.

## 2024-07-13 ENCOUNTER — Telehealth: Payer: Self-pay | Admitting: Emergency Medicine

## 2024-07-13 MED ORDER — TAMSULOSIN HCL 0.4 MG PO CAPS: ORAL_CAPSULE | ORAL | 0 refills | Status: AC

## 2024-07-13 MED ORDER — CEFADROXIL 500 MG PO CAPS: 500.0000 mg | ORAL_CAPSULE | Freq: Two times a day (BID) | ORAL | 0 refills | Status: AC

## 2024-07-13 MED ORDER — DOCUSATE SODIUM 100 MG PO CAPS: ORAL_CAPSULE | ORAL | 0 refills | Status: AC

## 2024-07-13 MED ORDER — MORPHINE SULFATE (PF) 4 MG/ML IV SOLN
4.0000 mg | Freq: Once | INTRAVENOUS | Status: AC
  Administered 2024-07-13: 4 mg via INTRAVENOUS
  Filled 2024-07-13: qty 1

## 2024-07-13 MED ORDER — KETOROLAC TROMETHAMINE 30 MG/ML IJ SOLN
15.0000 mg | Freq: Once | INTRAMUSCULAR | Status: AC
  Administered 2024-07-13: 15 mg via INTRAVENOUS
  Filled 2024-07-13: qty 1

## 2024-07-13 MED ORDER — SODIUM CHLORIDE 0.9 % IV SOLN
1.0000 g | INTRAVENOUS | Status: AC
  Administered 2024-07-13: 1 g via INTRAVENOUS
  Filled 2024-07-13: qty 10

## 2024-07-13 MED ORDER — OXYCODONE-ACETAMINOPHEN 5-325 MG PO TABS
2.0000 | ORAL_TABLET | Freq: Once | ORAL | Status: AC
  Administered 2024-07-13: 2 via ORAL
  Filled 2024-07-13: qty 2

## 2024-07-13 MED ORDER — OXYCODONE-ACETAMINOPHEN 5-325 MG PO TABS: 2.0000 | ORAL_TABLET | Freq: Four times a day (QID) | ORAL | 0 refills | Status: AC | PRN

## 2024-07-13 NOTE — Telephone Encounter (Signed)
Disregard, started in error

## 2024-07-13 NOTE — Discharge Instructions (Signed)
 Hoy ha sido atendido [facility] por dolor causado por clculos renales. Como hemos comentado, beba muchos lquidos. Por favor, programe una cita de seguimiento con el mdico o los mdicos que se indican en otra seccin de esta documentacin.  Puede tomar analgsicos segn sea necesario, pero NICAMENTE segn lo recetado. Tome tambin Flomax  diariamente, segn lo recetado. Si va a education officer, environmental un seguimiento con Urologa para una posible litotricia, no tome ibuprofeno, naproxeno, aspirina, Toradol  ni ningn otro AINE despus del martes por la [place], ya que esto podra impedirle someterse a la litotricia.  Consulte con su mdico lo antes posible, ya que la expulsin de los clculos puede tardar de 1 a 3 semanas y es posible que necesite atencin o medicamentos adicionales.  No consuma alcohol, no conduzca ni participe en ninguna otra actividad potencialmente peligrosa mientras est tomando analgsicos opiceos, ya que pueden causar somnolencia. No tome este medicamento con ningn otro medicamento sedante, ya sea recetado o de [employer]. Si le recetaron Percocet o Vicodin, no los tome con paracetamol (Tylenol ), ya que este ya est presente en estos medicamentos.  Tome Percocet segn sea necesario para el dolor intenso. Este medicamento es un analgsico opiceo (o narctico) y puede crear adiccin. selo lo menos posible para lograr un control adecuado del dolor. No lo use o selo con extrema precaucin si tiene antecedentes de abuso o dependencia de opiceos. Si tiene un contrato para el manejo del dolor con su mdico de cabecera o un especialista en dolor, asegrese de informarles que hoy le recetaron este medicamento en el [facility] [place] es solo para su uso personal; no se lo d a nadie ms y advice worker seguro donde nadie ms, especialmente los nios, tenga acceso a l. Tambin puede causar o empeorar el estreimiento, por lo que puede  considerar tomar un laxante de venta libre mientras est tomando este medicamento.  Regrese al [facility] o llame a su mdico si presenta empeoramiento del dolor, fiebre, miccin dolorosa, incapacidad para orinar o si desarrolla otros sntomas que le preocupen. ------------- You have been seen in the Emergency Department (ED) today for pain caused by kidney stones.  As we have discussed, please drink plenty of fluids.  Please make a follow up appointment with the physician(s) listed elsewhere in this documentation.  You may take pain medication as needed but ONLY as prescribed.  Please also take your prescribed Flomax  daily.  If you are going to follow up with Urology for possible lithotripsy, please do not take any ibuprofen , naproxen, aspirin, Toradol , or other NSAID after Tuesday morning, as this may exclude you from lithotripsy.  Please see your doctor as soon as possible as stones may take 1-3 weeks to pass and you may require additional care or medications.  Do not drink alcohol, drive or participate in any other potentially dangerous activities while taking opiate pain medication as it may make you sleepy. Do not take this medication with any other sedating medications, either prescription or over-the-counter. If you were prescribed Percocet or Vicodin, do not take these with acetaminophen  (Tylenol ) as it is already contained within these medications.   Take Percocet as needed for severe pain.  This medication is an opiate (or narcotic) pain medication and can be habit forming.  Use it as little as possible to achieve adequate pain control.  Do not use or use it with extreme caution if you have a history of opiate abuse or  dependence.  If you are on a pain contract with your primary care doctor or a pain specialist, be sure to let them know you were prescribed this medication today from the [facility] Emergency Department.  This medication is intended for your use only - do not  give any to anyone else and keep it in a secure place where nobody else, especially children, have access to it.  It will also cause or worsen constipation, so you may want to consider taking an over-the-counter stool softener while you are taking this medication.  Return to the Emergency Department (ED) or call your doctor if you have any worsening pain, fever, painful urination, are unable to urinate, or develop other symptoms that concern you.
